# Patient Record
Sex: Female | Born: 1995 | Race: Black or African American | Hispanic: No | Marital: Single | Smoking: Never smoker
Health system: Southern US, Community
[De-identification: ages and names within clinical notes are randomized; demographics above are authoritative.]

## PROBLEM LIST (undated history)

## (undated) DIAGNOSIS — F32A Depression, unspecified: Secondary | ICD-10-CM

## (undated) DIAGNOSIS — Z789 Other specified health status: Secondary | ICD-10-CM

## (undated) DIAGNOSIS — F419 Anxiety disorder, unspecified: Secondary | ICD-10-CM

## (undated) DIAGNOSIS — Q431 Hirschsprung's disease: Secondary | ICD-10-CM

## (undated) HISTORY — DX: Depression, unspecified: F32.A

## (undated) HISTORY — DX: Hirschsprung's disease: Q43.1

## (undated) HISTORY — PX: OTHER SURGICAL HISTORY: SHX169

## (undated) HISTORY — PX: NO PAST SURGERIES: SHX2092

## (undated) HISTORY — PX: LAPAROSCOPIC ENDO-RECTAL PULL THROUGH FOR HIRSCHSPRUNG'S DISEASE: SHX1923

---

## 2019-12-31 ENCOUNTER — Other Ambulatory Visit: Payer: Self-pay

## 2019-12-31 ENCOUNTER — Encounter (HOSPITAL_COMMUNITY): Payer: Self-pay

## 2019-12-31 ENCOUNTER — Inpatient Hospital Stay (HOSPITAL_COMMUNITY)
Admission: AD | Admit: 2019-12-31 | Discharge: 2019-12-31 | Disposition: A | Discharge status: Home / Self Care | Payer: 59 | Attending: Obstetrics and Gynecology | Admitting: Obstetrics and Gynecology

## 2019-12-31 ENCOUNTER — Inpatient Hospital Stay (HOSPITAL_COMMUNITY): Payer: 59

## 2019-12-31 ENCOUNTER — Ambulatory Visit (HOSPITAL_COMMUNITY)
Admission: EM | Admit: 2019-12-31 | Discharge: 2019-12-31 | Disposition: A | Discharge status: Home / Self Care | Payer: 59 | Source: Home / Self Care | Attending: Emergency Medicine | Admitting: Emergency Medicine

## 2019-12-31 ENCOUNTER — Encounter (HOSPITAL_COMMUNITY): Payer: Self-pay | Admitting: Obstetrics and Gynecology

## 2019-12-31 DIAGNOSIS — O200 Threatened abortion: Secondary | ICD-10-CM | POA: Diagnosis not present

## 2019-12-31 DIAGNOSIS — O209 Hemorrhage in early pregnancy, unspecified: Secondary | ICD-10-CM | POA: Diagnosis present

## 2019-12-31 DIAGNOSIS — O3680X0 Pregnancy with inconclusive fetal viability, not applicable or unspecified: Secondary | ICD-10-CM

## 2019-12-31 DIAGNOSIS — Z3A01 Less than 8 weeks gestation of pregnancy: Secondary | ICD-10-CM | POA: Diagnosis not present

## 2019-12-31 HISTORY — DX: Other specified health status: Z78.9

## 2019-12-31 LAB — URINALYSIS, ROUTINE W REFLEX MICROSCOPIC
Bacteria, UA: NONE SEEN
Bilirubin Urine: NEGATIVE
Glucose, UA: NEGATIVE mg/dL
Ketones, ur: 5 mg/dL — AB
Nitrite: NEGATIVE
Protein, ur: NEGATIVE mg/dL
RBC / HPF: >50 RBC/hpf — ABNORMAL HIGH (ref 0–5)
Specific Gravity, Urine: 1.015 (ref 1.005–1.030)
pH: 5 (ref 5.0–8.0)

## 2019-12-31 LAB — CBC
HCT: 40.1 % (ref 36.0–46.0)
Hemoglobin: 12.7 g/dL (ref 12.0–15.0)
MCH: 29.3 pg (ref 26.0–34.0)
MCHC: 31.7 g/dL (ref 30.0–36.0)
MCV: 92.4 fL (ref 80.0–100.0)
Platelets: 184 10*3/uL (ref 150–400)
RBC: 4.34 MIL/uL (ref 3.87–5.11)
RDW: 12.9 % (ref 11.5–15.5)
WBC: 4.9 10*3/uL (ref 4.0–10.5)
nRBC: 0 % (ref 0.0–0.2)

## 2019-12-31 LAB — POC URINE PREG, ED: Preg Test, Ur: POSITIVE — AB

## 2019-12-31 LAB — WET PREP, GENITAL
Sperm: NONE SEEN
Trich, Wet Prep: NONE SEEN
Yeast Wet Prep HPF POC: NONE SEEN

## 2019-12-31 LAB — POCT PREGNANCY, URINE: Preg Test, Ur: POSITIVE — AB

## 2019-12-31 LAB — HCG, QUANTITATIVE, PREGNANCY: hCG, Beta Chain, Quant, S: 2813 m[IU]/mL — ABNORMAL HIGH (ref <5)

## 2019-12-31 NOTE — MAU Note (Signed)
Pt fund out she was pregnant yesterday. Has had spotting on and off for 2 days. No pain.

## 2019-12-31 NOTE — ED Triage Notes (Signed)
Pt states [facility] and took pregnancy test and found out she was pregnant and they said she [redacted] wks pregnant by her last menstrual. Pt states she started having vaginal bleeding 2 days. Pt denies soaking more than 1 pad an hr. Pt states she been having light bleeding and has to change sanitary napkin every 4 hrs or so. Pt denies light headedness and dizziness.

## 2019-12-31 NOTE — ED Notes (Signed)
Patient is being discharged from the Urgent Care Center and sent to the Emergency Department. Per Cam Hai, PA, patient is stable but in need of higher level of care due to positive pregnancy test with vaginal bleeding. Patient is aware and verbalizes understanding of plan of care.  Vitals:   12/31/19 1647  BP: 121/80  Pulse: 72  Resp: 16  Temp: 99.2 F (37.3 C)

## 2019-12-31 NOTE — Discharge Instructions (Signed)
Return to care   If you have heavier bleeding that soaks through more that 2 pads per hour for an hour or more  If you bleed so much that you feel like you might pass out or you do pass out  If you have significant abdominal pain that is not improved with Tylenol       Miscarriage A miscarriage is the loss of an unborn baby (fetus) before the 20th week of pregnancy. Most miscarriages happen during the first 3 months of pregnancy. Sometimes, a miscarriage can happen before a woman knows that she is pregnant. Having a miscarriage can be an emotional experience. If you have had a miscarriage, talk with your health care provider about any questions you may have about miscarrying, the grieving process, and your plans for future pregnancy. What are the causes? A miscarriage may be caused by:  Problems with the genes or chromosomes of the fetus. These problems make it impossible for the baby to develop normally. They are often the result of random errors that occur early in the development of the baby, and are not passed from parent to child (not inherited).  Infection of the cervix or uterus.  Conditions that affect hormone balance in the body.  Problems with the cervix, such as the cervix opening and thinning before pregnancy is at term (cervical insufficiency).  Problems with the uterus. These may include: ? A uterus with an abnormal shape. ? Fibroids in the uterus. ? Congenital abnormalities. These are problems that were present at birth.  Certain medical conditions.  Smoking, drinking alcohol, or using drugs.  Injury (trauma). In many cases, the cause of a miscarriage is not known. What are the signs or symptoms? Symptoms of this condition include:  Vaginal bleeding or spotting, with or without cramps or pain.  Pain or cramping in the abdomen or lower back.  Passing fluid, tissue, or blood clots from the vagina. How is this diagnosed? This condition may be diagnosed based  on:  A physical exam.  Ultrasound.  Blood tests.  Urine tests. How is this treated? Treatment for a miscarriage is sometimes not necessary if you naturally pass all the tissue that was in your uterus. If necessary, this condition may be treated with:  Dilation and curettage (D&C). This is a procedure in which the cervix is stretched open and the lining of the uterus (endometrium) is scraped. This is done only if tissue from the fetus or placenta remains in the body (incomplete miscarriage).  Medicines, such as: ? Antibiotic medicine, to treat infection. ? Medicine to help the body pass any remaining tissue. ? Medicine to reduce (contract) the size of the uterus. These medicines may be given if you have a lot of bleeding. If you have Rh negative blood and your baby was Rh positive, you will need a shot of a medicine called Rh immunoglobulinto protect your future babies from Rh blood problems. "Rh-negative" and "Rh-positive" refer to whether or not the blood has a specific protein found on the surface of red blood cells (Rh factor). Follow these instructions at home: Medicines   Take over-the-counter and prescription medicines only as told by your health care provider.  If you were prescribed antibiotic medicine, take it as told by your health care provider. Do not stop taking the antibiotic even if you start to feel better.  Do not take NSAIDs, such as aspirin and ibuprofen, unless they are approved by your health care provider. These medicines can cause bleeding. Activity  Rest as directed. Ask your health care provider what activities are safe for you.  Have someone help with home and family responsibilities during this time. General instructions  Keep track of the number of sanitary pads you use each day and how soaked (saturated) they are. Write down this information.  Monitor the amount of tissue or blood clots that you pass from your vagina. Save any large amounts of tissue  for your health care provider to examine.  Do not use tampons, douche, or have sex until your health care provider approves.  To help you and your partner with the process of grieving, talk with your health care provider or seek counseling.  When you are ready, meet with your health care provider to discuss any important steps you should take for your health. Also, discuss steps you should take to have a healthy pregnancy in the future.  Keep all follow-up visits as told by your health care provider. This is important. Where to find more information  The American Congress of Obstetricians and Gynecologists: www.acog.org  [place] Department of Health and Human Services Office of Women's Health: www.womenshealth.gov Contact a health care provider if:  You have a fever or chills.  You have a foul smelling vaginal discharge.  You have more bleeding instead of less. Get help right away if:  You have severe cramps or pain in your back or abdomen.  You pass blood clots or tissue from your vagina that is walnut-sized or larger.  You soak more than 1 regular sanitary pad in an hour.  You become light-headed or weak.  You pass out.  You have feelings of sadness that take over your thoughts, or you have thoughts of hurting yourself. Summary  Most miscarriages happen in the first 3 months of pregnancy. Sometimes miscarriage happens before a woman even knows that she is pregnant.  Follow your health care provider's instruction for home care. Keep all follow-up appointments.  To help you and your partner with the process of grieving, talk with your health care provider or seek counseling. This information is not intended to replace advice given to you by your health care provider. Make sure you discuss any questions you have with your health care provider. Document Revised: 12/31/2018 Document Reviewed: 10/14/2016 Elsevier Patient Education  2020 Elsevier Inc.  

## 2019-12-31 NOTE — MAU Provider Note (Signed)
Chief Complaint: Vaginal Bleeding   First Provider Initiated Contact with Patient 12/31/19 1855     SUBJECTIVE HPI: Jennifer Norman is a 24 y.o. G1P0 at [redacted]w[redacted]d who presents [facility] reporting vaginal bleeding. Symptoms started a few days ago. Reports bright red spotting on pad. Changes her pad about every 4 hours. Not saturating pads or passing blood clots. Denies abdominal pain, dysuria, vaginal discharge, or recent intercourse. This is her first pregnancy & she doesn't have an ob/gyn.    Past Medical History:  Diagnosis Date  . Medical history non-contributory    OB History  Gravida Para Term Preterm AB Living  1            SAB TAB Ectopic Multiple Live Births               # Outcome Date GA Lbr Len/2nd Weight Sex Delivery Anes PTL Lv  1 Current            No past surgical history on file. Social History   Socioeconomic History  . Marital status: Single    Spouse name: Not on file  . Number of children: Not on file  . Years of education: Not on file  . Highest education level: Not on file  Occupational History  . Not on file  Tobacco Use  . Smoking status: Never Smoker  . Smokeless tobacco: Never Used  Substance and Sexual Activity  . Alcohol use: Not Currently  . Drug use: Never  . Sexual activity: Not on file  Other Topics Concern  . Not on file  Social History Narrative  . Not on file   Social Determinants of Health   Financial Resource Strain:   . Difficulty of Paying Living Expenses:   Food Insecurity:   . Worried About Programme researcher, broadcasting/film/video in the Last Year:   . Barista in the Last Year:   Transportation Needs:   . Freight forwarder (Medical):   Marland Kitchen Lack of Transportation (Non-Medical):   Physical Activity:   . Days of Exercise per Week:   . Minutes of Exercise per Session:   Stress:   . Feeling of Stress :   Social Connections:   . Frequency of Communication with Friends and Family:   . Frequency of Social Gatherings with  Friends and Family:   . Attends Religious Services:   . Active Member of Clubs or Organizations:   . Attends Banker Meetings:   Marland Kitchen Marital Status:   Intimate Partner Violence:   . Fear of Current or Ex-Partner:   . Emotionally Abused:   Marland Kitchen Physically Abused:   . Sexually Abused:    Family History  Problem Relation Age of Onset  . Asthma Brother    No current facility-administered medications on file prior to encounter.   Current Outpatient Medications on File Prior to Encounter  Medication Sig Dispense Refill  . clindamycin (CLEOCIN) 150 MG capsule Take by mouth 2 (two) times daily.     No Known Allergies  I have reviewed patient's Past Medical Hx, Surgical Hx, Family Hx, Social Hx, medications and allergies.   Review of Systems  Constitutional: Negative.   Gastrointestinal: Negative.   Genitourinary: Positive for vaginal bleeding. Negative for dysuria and vaginal discharge.    OBJECTIVE Patient Vitals for the past 24 hrs:  BP Temp Pulse Resp  12/31/19 1834 123/60 99.2 F (37.3 C) 63 18   Constitutional: Well-developed, well-nourished female in no acute distress.  Cardiovascular: normal rate & rhythm, no murmur Respiratory: normal rate and effort. Lung sounds clear throughout GI: Abd soft, non-tender, Pos BS x 4. No guarding or rebound tenderness MS: Extremities nontender, no edema, normal ROM Neurologic: Alert and oriented x 4.  GU:     SPECULUM EXAM: NEFG, small amount of dark red blood. Cervix pink/smooth/not friable  BIMANUAL: No CMT. cervix closed; uterus normal size, no adnexal tenderness or masses.    LAB RESULTS Results for orders placed or performed during the hospital encounter of 12/31/19 (from the past 24 hour(s))  Urinalysis, Routine w reflex microscopic     Status: Abnormal   Collection Time: 12/31/19  6:27 PM  Result Value Ref Range   Color, Urine YELLOW YELLOW   APPearance CLEAR CLEAR   Specific Gravity, Urine 1.015 1.005 - 1.030   pH  5.0 5.0 - 8.0   Glucose, UA NEGATIVE NEGATIVE mg/dL   Hgb urine dipstick LARGE (A) NEGATIVE   Bilirubin Urine NEGATIVE NEGATIVE   Ketones, ur 5 (A) NEGATIVE mg/dL   Protein, ur NEGATIVE NEGATIVE mg/dL   Nitrite NEGATIVE NEGATIVE   Leukocytes,Ua TRACE (A) NEGATIVE   RBC / HPF >50 (H) 0 - 5 RBC/hpf   WBC, UA 0-5 0 - 5 WBC/hpf   Bacteria, UA NONE SEEN NONE SEEN   Squamous Epithelial / LPF 0-5 0 - 5   Mucus PRESENT   CBC     Status: None   Collection Time: 12/31/19  6:44 PM  Result Value Ref Range   WBC 4.9 4.0 - 10.5 K/uL   RBC 4.34 3.87 - 5.11 MIL/uL   Hemoglobin 12.7 12.0 - 15.0 g/dL   HCT 40.1 36.0 - 46.0 %   MCV 92.4 80.0 - 100.0 fL   MCH 29.3 26.0 - 34.0 pg   MCHC 31.7 30.0 - 36.0 g/dL   RDW 12.9 11.5 - 15.5 %   Platelets 184 150 - 400 K/uL   nRBC 0.0 0.0 - 0.2 %  hCG, quantitative, pregnancy     Status: Abnormal   Collection Time: 12/31/19  6:44 PM  Result Value Ref Range   hCG, Beta Chain, Quant, S 2,813 (H) <5 mIU/mL  ABO/Rh     Status: None   Collection Time: 12/31/19  6:45 PM  Result Value Ref Range   ABO/RH(D)      O POS Performed at [facility], [address], [place],  [address]   Wet prep, genital     Status: Abnormal   Collection Time: 12/31/19  7:19 PM   Specimen: Vaginal  Result Value Ref Range   Yeast Wet Prep HPF POC NONE SEEN NONE SEEN   Trich, Wet Prep NONE SEEN NONE SEEN   Clue Cells Wet Prep HPF POC PRESENT (A) NONE SEEN   WBC, Wet Prep HPF POC FEW (A) NONE SEEN   Sperm NONE SEEN     IMAGING US OB LESS THAN 14 WEEKS WITH OB TRANSVAGINAL  Result Date: 12/31/2019 CLINICAL DATA:  Spotting, no pain EXAM: OBSTETRIC <14 WK [place] AND TRANSVAGINAL OB US TECHNIQUE: Both transabdominal and transvaginal ultrasound examinations were performed for complete evaluation of the gestation as well as the maternal uterus, adnexal regions, and pelvic cul-de-sac. Transvaginal technique was performed to assess early pregnancy. COMPARISON:  None. FINDINGS:  Intrauterine gestational sac: Single intrauterine gestational sac visible within the cervix. Yolk sac:  Visualized. Embryo:  Visualized. Cardiac Activity: Visualized. Heart Rate: 58 bpm CRL: 3.7 mm   6 w  0 d                  [place] EDC: 08/25/2020 Subchorionic hemorrhage:  None visualized. Maternal uterus/adnexae: Ovaries are within normal limits. Right ovary measures 4.8 x 2.9 x 2.7 cm. The left ovary measures 3.6 x 2.1 x 2.1 cm. No significant free fluid. IMPRESSION: Single intrauterine pregnancy, though with the gestational sac, yolk sac and embryo identified within the cervix. There is fetal bradycardia. Concern is raised for cervical ectopic pregnancy versus miscarriage in progress. Short interval ultrasound follow-up may be helpful in distinguishing between the 2. Electronically Signed   By: Jasmine Pang M.D.   On: 12/31/2019 20:04    MAU COURSE Orders Placed This Encounter  Procedures  . Wet prep, genital  . US OB LESS THAN 14 WEEKS WITH OB TRANSVAGINAL  . US OB Transvaginal  . CBC  . hCG, quantitative, pregnancy  . Urinalysis, Routine w reflex microscopic  . ABO/Rh  . Discharge patient   No orders of the defined types were placed in this encounter.   MDM +UPT UA, wet prep, GC/chlamydia, CBC, ABO/Rh, quant hCG, and [place] today to rule out ectopic pregnancy which can be life threatening.   RH positive  Ultrasound shows GS with embryo, FHR 58; pregnancy appears to be in the cervix. Miscarriage in process vs cervical ectopic pregnancy.  Reviewed results with Dr. Jolayne Panther. Will get ultrasound on Friday to reassess location & viability of pregnancy.   Discussed results with pregnancy. Likely a miscarriage but can't completely exclude an ectopic pregnancy. Discussed reasons to return to MAU  ASSESSMENT 1. Threatened miscarriage   2. Vaginal bleeding affecting early pregnancy   3. Pregnancy of unknown anatomic location     PLAN Discharge home in stable condition. Bleeding, pain,  infection precautions Pelvic rest Outpatient ultrasound ordered for Friday   Follow-up Information    [facility].   Specialty: Radiology Why: office will call you to schedule an appointment Contact information: [address] [place] [address], [address] [id number] mc [place] [address] [phone]         Allergies as of 12/31/2019   No Known Allergies     Medication List    STOP taking these medications   clindamycin 150 MG capsule Commonly known as: Osa Craver, Denny Peon, NP 12/31/2019  8:25 PM

## 2019-12-31 NOTE — Discharge Instructions (Addendum)
Please report to the [facility] clinic/MAU for further evaluation.

## 2019-12-31 NOTE — ED Provider Notes (Signed)
Patient reports this urgent care today for evaluation of vaginal bleeding after being told she was pregnant by her [facility].  Patient has positive pregnancy test in this urgent care with vaginal bleeding.  Discussed with patient that she should report to the MAU as this is our policy.  Patient agrees and she will report there following discharge from urgent care.   Hermelinda Medicus, PA-C 12/31/19 1711

## 2020-01-02 LAB — GC/CHLAMYDIA PROBE AMP (~~LOC~~) NOT AT ARMC
Chlamydia: NEGATIVE
Comment: NEGATIVE
Comment: NORMAL
Neisseria Gonorrhea: NEGATIVE

## 2020-01-02 LAB — ABO/RH: ABO/RH(D): O POS

## 2020-01-10 ENCOUNTER — Inpatient Hospital Stay (HOSPITAL_COMMUNITY): Payer: 59

## 2020-01-10 ENCOUNTER — Other Ambulatory Visit: Payer: Self-pay

## 2020-01-10 ENCOUNTER — Ambulatory Visit (HOSPITAL_COMMUNITY): Payer: 59

## 2020-01-10 ENCOUNTER — Encounter (HOSPITAL_COMMUNITY): Payer: Self-pay | Admitting: Family Medicine

## 2020-01-10 ENCOUNTER — Inpatient Hospital Stay (HOSPITAL_COMMUNITY)
Admission: AD | Admit: 2020-01-10 | Discharge: 2020-01-10 | Disposition: A | Discharge status: Home / Self Care | Payer: 59 | Attending: Family Medicine | Admitting: Family Medicine

## 2020-01-10 DIAGNOSIS — O209 Hemorrhage in early pregnancy, unspecified: Secondary | ICD-10-CM | POA: Diagnosis present

## 2020-01-10 DIAGNOSIS — O021 Missed abortion: Secondary | ICD-10-CM | POA: Diagnosis not present

## 2020-01-10 DIAGNOSIS — O034 Incomplete spontaneous abortion without complication: Secondary | ICD-10-CM | POA: Insufficient documentation

## 2020-01-10 DIAGNOSIS — R109 Unspecified abdominal pain: Secondary | ICD-10-CM

## 2020-01-10 DIAGNOSIS — Z3A08 8 weeks gestation of pregnancy: Secondary | ICD-10-CM | POA: Insufficient documentation

## 2020-01-10 DIAGNOSIS — O0289 Other abnormal products of conception: Secondary | ICD-10-CM

## 2020-01-10 DIAGNOSIS — O26899 Other specified pregnancy related conditions, unspecified trimester: Secondary | ICD-10-CM

## 2020-01-10 HISTORY — DX: Anxiety disorder, unspecified: F41.9

## 2020-01-10 LAB — URINALYSIS, ROUTINE W REFLEX MICROSCOPIC
Bilirubin Urine: NEGATIVE
Glucose, UA: NEGATIVE mg/dL
Ketones, ur: NEGATIVE mg/dL
Nitrite: NEGATIVE
Protein, ur: NEGATIVE mg/dL
Specific Gravity, Urine: 1.024 (ref 1.005–1.030)
pH: 5 (ref 5.0–8.0)

## 2020-01-10 LAB — CBC
HCT: 38.6 % (ref 36.0–46.0)
Hemoglobin: 12.2 g/dL (ref 12.0–15.0)
MCH: 28.8 pg (ref 26.0–34.0)
MCHC: 31.6 g/dL (ref 30.0–36.0)
MCV: 91.3 fL (ref 80.0–100.0)
Platelets: 191 10*3/uL (ref 150–400)
RBC: 4.23 MIL/uL (ref 3.87–5.11)
RDW: 12.7 % (ref 11.5–15.5)
WBC: 4 10*3/uL (ref 4.0–10.5)
nRBC: 0 % (ref 0.0–0.2)

## 2020-01-10 LAB — HCG, QUANTITATIVE, PREGNANCY: hCG, Beta Chain, Quant, S: 2136 m[IU]/mL — ABNORMAL HIGH (ref <5)

## 2020-01-10 MED ORDER — HYDROCODONE-ACETAMINOPHEN 5-325 MG PO TABS: 2.0000 | ORAL_TABLET | ORAL | 0 refills | Status: DC | PRN

## 2020-01-10 MED ORDER — PROMETHAZINE HCL 25 MG PO TABS: 12.5000 mg | ORAL_TABLET | Freq: Four times a day (QID) | ORAL | 0 refills | Status: DC | PRN

## 2020-01-10 MED ORDER — IBUPROFEN 600 MG PO TABS: 600.0000 mg | ORAL_TABLET | Freq: Four times a day (QID) | ORAL | 3 refills | Status: DC | PRN

## 2020-01-10 MED ORDER — HYDROCODONE-ACETAMINOPHEN 5-325 MG PO TABS: 2.0000 | ORAL_TABLET | ORAL | 0 refills | Status: AC | PRN

## 2020-01-10 MED ORDER — MISOPROSTOL 200 MCG PO TABS
800.0000 ug | ORAL_TABLET | Freq: Once | ORAL | Status: AC
  Administered 2020-01-10: 800 ug via VAGINAL
  Filled 2020-01-10: qty 4

## 2020-01-10 NOTE — MAU Provider Note (Signed)
History     CSN: [id number]  Arrival date and time: 01/10/20 0932  First Provider Initiated Contact with Patient 01/10/20 1051     Chief Complaint  Patient presents with  . Vaginal Bleeding  . Abdominal Pain   HPI Jennifer Norman is a 24 y.o. G1P0 at [redacted]w[redacted]d who presents to MAU with chief complaints of abdominal pain and vaginal bleeding. Her vaginal bleeding is recurrent and caused her to seek evaluation in MAU on 12/31/2019. Her abdominal pain is new, onset Friday 04/16, sharp, and occurring about 1-2 times each day. She denies pain on arrival to MAU.   Patient states she was unable to secure follow-up in office. She denies dysuria, abdominal tenderness, fever or recent illness.  OB History    Gravida  1   Para      Term      Preterm      AB      Living        SAB      TAB      Ectopic      Multiple      Live Births              Past Medical History:  Diagnosis Date  . Anxiety   . Medical history non-contributory     Past Surgical History:  Procedure Laterality Date  . NO PAST SURGERIES      Family History  Problem Relation Age of Onset  . Asthma Brother   . Healthy Mother   . Healthy Father     Social History   Tobacco Use  . Smoking status: Never Smoker  . Smokeless tobacco: Never Used  Substance Use Topics  . Alcohol use: Not Currently  . Drug use: Never    Allergies: No Known Allergies  No medications prior to admission.    Review of Systems  Gastrointestinal: Positive for abdominal pain.  Genitourinary: Positive for vaginal bleeding.  All other systems reviewed and are negative.  Physical Exam   Blood pressure 119/61, pulse 70, temperature 98.6 F (37 C), temperature source Oral, resp. rate 16, height 5\' 9"  (1.753 m), weight 94.5 kg, last menstrual period 11/12/2019, SpO2 96 %.  Physical Exam  Nursing note and vitals reviewed. Constitutional: She is oriented to person, place, and time. She appears well-developed and  well-nourished.  Cardiovascular: Normal rate and normal heart sounds.  Respiratory: Effort normal and breath sounds normal.  GI: Soft. Bowel sounds are normal. She exhibits no distension. There is no abdominal tenderness. There is no rebound and no guarding.  Genitourinary:    Vaginal discharge present.     Genitourinary Comments: Pelvic exam: External genitalia normal, vaginal walls pink and well rugated, cervix visually closed, no lesions noted. Small amount dark red blood visible near cervical os. Removed with fox swab x 1    Neurological: She is alert and oriented to person, place, and time.  Skin: Skin is warm and dry.  Psychiatric: She has a normal mood and affect. Her behavior is normal. Judgment and thought content normal.    MAU Course/MDM  Procedures: Speculum exam, transvaginal ultrasound  --MAU notes reviewed. Previous ultrasound visualized GS, YS and embryo within cervix as well as fetal bradycardia.  Patient Vitals for the past 24 hrs:  BP Temp Temp src Pulse Resp SpO2 Height Weight  01/10/20 1337 118/64 98.2 F (36.8 C) Oral 60 17 99 % -- --  01/10/20 1028 119/61 -- -- 70 16 -- -- 94.5 kg  01/10/20 0927 127/68 98.6 F (37 C) Oral 88 16 96 % 5\' 9"  (1.753 m) --   Results for orders placed or performed during the hospital encounter of 01/10/20 (from the past 24 hour(s))  Urinalysis, Routine w reflex microscopic     Status: Abnormal   Collection Time: 01/10/20 10:55 AM  Result Value Ref Range   Color, Urine YELLOW YELLOW   APPearance HAZY (A) CLEAR   Specific Gravity, Urine 1.024 1.005 - 1.030   pH 5.0 5.0 - 8.0   Glucose, UA NEGATIVE NEGATIVE mg/dL   Hgb urine dipstick LARGE (A) NEGATIVE   Bilirubin Urine NEGATIVE NEGATIVE   Ketones, ur NEGATIVE NEGATIVE mg/dL   Protein, ur NEGATIVE NEGATIVE mg/dL   Nitrite NEGATIVE NEGATIVE   Leukocytes,Ua SMALL (A) NEGATIVE   RBC / HPF 21-50 0 - 5 RBC/hpf   WBC, UA 21-50 0 - 5 WBC/hpf   Bacteria, UA RARE (A) NONE SEEN    Squamous Epithelial / LPF 0-5 0 - 5   Mucus PRESENT   CBC     Status: None   Collection Time: 01/10/20 11:14 AM  Result Value Ref Range   WBC 4.0 4.0 - 10.5 K/uL   RBC 4.23 3.87 - 5.11 MIL/uL   Hemoglobin 12.2 12.0 - 15.0 g/dL   HCT 38.6 36.0 - 46.0 %   MCV 91.3 80.0 - 100.0 fL   MCH 28.8 26.0 - 34.0 pg   MCHC 31.6 30.0 - 36.0 g/dL   RDW 12.7 11.5 - 15.5 %   Platelets 191 150 - 400 K/uL   nRBC 0.0 0.0 - 0.2 %  hCG, quantitative, pregnancy     Status: Abnormal   Collection Time: 01/10/20 11:14 AM  Result Value Ref Range   hCG, Beta Chain, Quant, S 2,136 (H) <5 mIU/mL   US OB Transvaginal  Result Date: 01/10/2020 CLINICAL DATA:  Vaginal bleeding in first trimester of pregnancy, bleeding since 12/30/2019, fetal bradycardia on prior exam, LMP 11/12/2019 EXAM: TRANSVAGINAL OB ULTRASOUND TECHNIQUE: Transvaginal ultrasound was performed for complete evaluation of the gestation as well as the maternal uterus, adnexal regions, and pelvic cul-de-sac. COMPARISON:  12/31/2019 FINDINGS: Intrauterine gestational sac: Present, abnormal, elongated, now located at lower uterine segment, previously at upper uterine segment Yolk sac:  N/A Embryo:  N/A Cardiac Activity: N/A Heart Rate: N/A bpm MSD: 8.8 mm   5 w   4 d Subchorionic hemorrhage: Blood identified surrounding gestational sac and products of conception within endometrial canal Maternal uterus/adnexae: No additional uterine masses. RIGHT ovary normal size and morphology 3.4 x 1.8 x 2.0 cm. LEFT ovary normal size and morphology, 2.8 x 2.6 x 2.1 cm. No free pelvic fluid or adnexal masses. IMPRESSION: Previously identified gestational sac is now elongated and seen at the lower uterine segment surrounded by blood, without visualization of the fetal pole and yolk sac identified on prior study. Findings meet definitive criteria for failed pregnancy. This follows SRU consensus guidelines: Diagnostic Criteria for Nonviable Pregnancy Early in the First Trimester.  Alison Stalling J Med [phone]. Electronically Signed   By: Lavonia Dana M.D.   On: 01/10/2020 12:08    --Confirmed with Dr. Kennon Rounds that patient is appropriate for Cytotec administration  Early Intrauterine Pregnancy Failure  X  Documented intrauterine pregnancy failure less than or equal to [redacted] weeks gestation  X  No serious current illness  X  Baseline Hgb greater than or equal to 10g/dl  X  Patient has easily accessible transportation to the hospital  X  Clear preference  X  Practitioner/physician deems patient reliable  X  Counseling by practitioner or physician  X  Patient education by RN  N/A  Rho-Gam given by RN if indicated  X Medication dispensed: Intravaginally by RN in MAU  X  Ibuprofen 600 mg 1 tablet by mouth every 6 hours as needed #30  X  Hydrocodone/acetaminophen 5/325 mg by mouth every 4 to 6 hours as needed  X  Phenergan 12.5 mg by mouth every 4 hours as needed for nausea  Meds ordered this encounter  Medications  . misoprostol (CYTOTEC) tablet 800 mcg    Failed pregnancy  . HYDROcodone-acetaminophen (NORCO/VICODIN) 5-325 MG tablet    Sig: Take 2 tablets by mouth every 4 (four) hours as needed for up to 3 days.    Dispense:  6 tablet    Refill:  0    Order Specific Question:   Supervising Provider    Answer:   Reva Bores [[id number]]  . ibuprofen (ADVIL) 600 MG tablet    Sig: Take 1 tablet (600 mg total) by mouth every 6 (six) hours as needed.    Dispense:  60 tablet    Refill:  3    Order Specific Question:   Supervising Provider    Answer:   Reva Bores [[id number]]  . promethazine (PHENERGAN) 25 MG tablet    Sig: Take 0.5 tablets (12.5 mg total) by mouth every 6 (six) hours as needed for nausea or vomiting.    Dispense:  30 tablet    Refill:  0    Order Specific Question:   Supervising Provider    Answer:   Reva Bores [[id number]]   Assessment and Plan  --24 y.o. G1P0 at [redacted]w[redacted]d  --Confirmed failed pregnancy --S/p Cytotec administered in MAU --Blood  type O POS --Discharge home in stable condition  F/U: --Non-stat Quant hCG in one week at [facility] --Message sent to clinic to coordinate appointment  Calvert Cantor, CNM 01/10/2020, 5:07 PM

## 2020-01-10 NOTE — MAU Note (Signed)
Was to have followed up in clinic last wk, they did not call and she states she tried to call and got nowhere. Started bleeding on the 9th. Has slowed down, but has continued to bleed.  Pain started on Friday, sharp pains in lower abd.

## 2020-01-10 NOTE — Discharge Instructions (Signed)
Prostaglandin-Induced Abortion, Care After This sheet gives you information about how to care for yourself after your procedure. Your health care provider may also give you more specific instructions. If you have problems or questions, contact your health care provider. What can I expect after the procedure? After the procedure, it is common to have:  Bleeding that lasts for a few hours or a few days. It may feel like you are having a heavy menstrual period.  A headache.  Diarrhea.  Nausea and vomiting.  Chills.  Dizziness. Your next period will most likely start 4-6 weeks after the procedure, unless you start taking birth control pills. Follow these instructions at home: Medicines   Take over-the-counter and prescription medicines only as told by your health care provider.  Only take the medicines your health care provider recommends. Do not take aspirin. It can cause bleeding. Activity  Do not have sex for 2-3 weeks or until your health care provider approves.  Rest and avoid activity that requires a lot of energy for 2-3 weeks.  Do not drive or use heavy machinery while taking prescription pain medicine. General instructions  There will be bleeding after the procedure. It is recommended that you: ? Write down how many menstrual pads you use each day and how soaked they are. This could be useful information for your health care provider. ? Check for any large blood clots or tissue when you change your menstrual pad. If you pass tissue, save the tissue to show to your health care provider.  Do not douche or use tampons until your health care provider approves.  Ask your health care provider when you can start using hormonal birth control (contraception).  Keep all follow-up visits as told by your health care provider. This is important. Contact a health care provider if:  You have chills or a fever.  You have pain that is not relieved by prescription pain  medicine.  You have a bad-smelling vaginal discharge.  You have pain or bleeding that gets worse instead of better.  You have any of the following for more than 24 hours: ? Nausea. ? Vomiting. ? Diarrhea. Get help right away if:  You have severe cramps in your stomach, back, or abdomen.  You pass large blood clots or tissue out of your vagina. Save any tissue for your health care provider to inspect.  You need to change your pad more than once in an hour.  You become light-headed, weak, or faint. Summary  After the procedure it is common to have bleeding, headache, diarrhea, nausea and vomiting, chills, and dizziness.  Take over-the-counter and prescription medicines only as told by your health care provider.  Do not have sex for 2-3 weeks or until your health care provider approves.  When you are bleeding after the procedure, write down how many menstrual pads you use each day and how soaked they are.  Keep all follow-up visits as told by your health care provider. This is important. This information is not intended to replace advice given to you by your health care provider. Make sure you discuss any questions you have with your health care provider. Document Revised: 08/21/2017 Document Reviewed: 11/26/2016 Elsevier Patient Education  2020 [employer].

## 2020-01-11 ENCOUNTER — Telehealth: Payer: Self-pay | Admitting: General Practice

## 2020-01-11 NOTE — Telephone Encounter (Signed)
-----   Message from Jennifer Norman, [place] sent at 01/10/2020  4:58 PM EDT ----- Patient with nonviable pregnancy, s/p Cytotec in MAU. Needs repeat non-stat Quant hCG in one week please

## 2020-01-11 NOTE — Telephone Encounter (Signed)
Pt aware of appointment scheduled on Tuesday, 01/17/2020 at 9:00am for repeat non-stat Quant hCG.  Patient verbalized understanding.

## 2020-01-12 ENCOUNTER — Ambulatory Visit (HOSPITAL_COMMUNITY): Admission: RE | Admit: 2020-01-12 | Payer: 59 | Source: Ambulatory Visit

## 2020-01-17 ENCOUNTER — Other Ambulatory Visit: Payer: 59

## 2020-02-06 ENCOUNTER — Ambulatory Visit: Admission: RE | Admit: 2020-02-06 | Payer: 59 | Source: Ambulatory Visit

## 2020-02-08 ENCOUNTER — Inpatient Hospital Stay: Admission: RE | Admit: 2020-02-08 | Payer: 59 | Source: Ambulatory Visit

## 2020-02-08 ENCOUNTER — Ambulatory Visit
Admission: RE | Admit: 2020-02-08 | Discharge: 2020-02-08 | Disposition: A | Discharge status: Home / Self Care | Payer: 59 | Source: Ambulatory Visit | Attending: Student | Admitting: Student

## 2020-02-08 ENCOUNTER — Ambulatory Visit (INDEPENDENT_AMBULATORY_CARE_PROVIDER_SITE_OTHER): Payer: 59 | Admitting: *Deleted

## 2020-02-08 ENCOUNTER — Other Ambulatory Visit: Payer: Self-pay

## 2020-02-08 DIAGNOSIS — O3680X0 Pregnancy with inconclusive fetal viability, not applicable or unspecified: Secondary | ICD-10-CM | POA: Diagnosis present

## 2020-02-08 DIAGNOSIS — O200 Threatened abortion: Secondary | ICD-10-CM | POA: Insufficient documentation

## 2020-02-08 DIAGNOSIS — Z712 Person consulting for explanation of examination or test findings: Secondary | ICD-10-CM

## 2020-02-08 DIAGNOSIS — O209 Hemorrhage in early pregnancy, unspecified: Secondary | ICD-10-CM | POA: Insufficient documentation

## 2020-02-08 NOTE — Progress Notes (Signed)
[place] results reviewed by Dr. Debroah Loop. Pt was informed that the results are consistent with complete miscarriage. She does not desire birth control @ this time. She was advised to use condoms until first normal period occurs. Pt requests follow up w/MD and needs annual Gyn exam w/Pap. Appointment will be scheduled. Pt voiced understanding of all information given and had no questions.

## 2020-02-10 NOTE — Progress Notes (Signed)
Patient ID: Jennifer Norman, female   DOB: 26-Mar-1996, 24 y.o.   MRN: [id number] Patient was assessed and managed by nursing staff during this encounter. I have reviewed the chart and agree with the documentation and plan. I have also made any necessary editorial changes. I reviewed the [place] result from 02/08/20.  Scheryl Darter, MD 02/10/2020 11:12 AM

## 2020-03-13 ENCOUNTER — Ambulatory Visit: Payer: 59 | Admitting: Women's Health

## 2020-03-13 ENCOUNTER — Encounter: Payer: Self-pay | Admitting: Women's Health

## 2020-06-02 ENCOUNTER — Other Ambulatory Visit: Payer: Self-pay

## 2020-06-02 ENCOUNTER — Ambulatory Visit (HOSPITAL_COMMUNITY)
Admission: EM | Admit: 2020-06-02 | Discharge: 2020-06-02 | Disposition: A | Discharge status: Home / Self Care | Payer: 59 | Attending: Family Medicine | Admitting: Family Medicine

## 2020-06-02 ENCOUNTER — Encounter (HOSPITAL_COMMUNITY): Payer: Self-pay

## 2020-06-02 DIAGNOSIS — R109 Unspecified abdominal pain: Secondary | ICD-10-CM

## 2020-06-02 DIAGNOSIS — R1030 Lower abdominal pain, unspecified: Secondary | ICD-10-CM | POA: Diagnosis not present

## 2020-06-02 DIAGNOSIS — Z3201 Encounter for pregnancy test, result positive: Secondary | ICD-10-CM | POA: Diagnosis present

## 2020-06-02 LAB — POCT URINALYSIS DIPSTICK, ED / UC
Bilirubin Urine: NEGATIVE
Glucose, UA: NEGATIVE mg/dL
Hgb urine dipstick: NEGATIVE
Ketones, ur: NEGATIVE mg/dL
Leukocytes,Ua: NEGATIVE
Nitrite: NEGATIVE
Protein, ur: NEGATIVE mg/dL
Specific Gravity, Urine: 1.025 (ref 1.005–1.030)
Urobilinogen, UA: 1 mg/dL (ref 0.0–1.0)
pH: 7 (ref 5.0–8.0)

## 2020-06-02 LAB — POC URINE PREG, ED: Preg Test, Ur: POSITIVE — AB

## 2020-06-02 NOTE — ED Triage Notes (Signed)
Pt is pregnant, but not sure how far along she is. Pt states she did a home pregnancy test two days ago and it was +. Pt c/o 2/10 abdominal discomfort in LLQ, RLQ of abdomen and epigastric area that started yesterday after she ate. Pt c/o 5/10 non radiating pressure on right side of chest that started today. Pt states she's always had the chest pain from "time to time, but recently has been more frequent. She states she doesn't know if it's due to stress or what. Pt denies SOB or vomiting. Pt has non labored breathing.

## 2020-06-02 NOTE — Discharge Instructions (Signed)
Schedule a new OB appointment with an OBGYN of your choice as soon as you are able

## 2020-06-02 NOTE — ED Provider Notes (Signed)
[facility]    CSN: [id number] Arrival date & time: 06/02/20  1546      History   Chief Complaint Chief Complaint  Patient presents with  . Abdominal Pain    HPI Jennifer Norman is a 24 y.o. female.    Intermittent sharp pains and pressure b/l lower abdomen and epigastric area since yesterday that she rates 2/10. Also had some episodic chest pain last night that has been a reoccurring thing for her and has been worked up in the past. Significant stress lately with recently finding out she's pregnant, feels her chest pain is directly related to stress. LMP was 03/29/2020 and positive home preg test 2 days ago. Denies vaginal bleeding, N/V/D, vaginal discharge, fever, chills, concern for STIs. Of note, her previous pregnancy ended in a miscarriage. Does not yet have an OBGYN.      Past Medical History:  Diagnosis Date  . Anxiety   . Medical history non-contributory     Patient Active Problem List   Diagnosis Date Noted  . Nonviable pregnancy 01/10/2020    Past Surgical History:  Procedure Laterality Date  . NO PAST SURGERIES      OB History    Gravida  1   Para      Term      Preterm      AB      Living        SAB      TAB      Ectopic      Multiple      Live Births               Home Medications    Prior to Admission medications   Medication Sig Start Date End Date Taking? Authorizing Provider  ibuprofen (ADVIL) 600 MG tablet Take 1 tablet (600 mg total) by mouth every 6 (six) hours as needed. 01/10/20   Calvert Cantor, CNM  promethazine (PHENERGAN) 25 MG tablet Take 0.5 tablets (12.5 mg total) by mouth every 6 (six) hours as needed for nausea or vomiting. 01/10/20   Calvert Cantor, CNM    Family History Family History  Problem Relation Age of Onset  . Asthma Brother   . Healthy Mother   . Healthy Father     Social History Social History   Tobacco Use  . Smoking status: Never Smoker  . Smokeless tobacco: Never  Used  Vaping Use  . Vaping Use: Never used  Substance Use Topics  . Alcohol use: Not Currently  . Drug use: Never     Allergies   Patient has no known allergies.   Review of Systems Review of Systems PER HPI    Physical Exam Triage Vital Signs ED Triage Vitals  Enc Vitals Group     BP 06/02/20 1719 107/64     Pulse Rate 06/02/20 1719 81     Resp 06/02/20 1719 16     Temp 06/02/20 1719 99.4 F (37.4 C)     Temp Source 06/02/20 1719 Oral     SpO2 06/02/20 1719 100 %     Weight 06/02/20 1720 202 lb (91.6 kg)     Height 06/02/20 1720 5\' 9"  (1.753 m)     Head Circumference --      Peak Flow --      Pain Score 06/02/20 1719 5     Pain Loc --      Pain Edu? --      Excl. in GC? --  No data found.  Updated Vital Signs BP 107/64   Pulse 81   Temp 99.4 F (37.4 C) (Oral)   Resp 16   Ht 5\' 9"  (1.753 m)   Wt 202 lb (91.6 kg)   LMP 11/12/2019   SpO2 100%   BMI 29.83 kg/m   Visual Acuity Right Eye Distance:   Left Eye Distance:   Bilateral Distance:    Right Eye Near:   Left Eye Near:    Bilateral Near:     Physical Exam Vitals and nursing note reviewed.  Constitutional:      Appearance: Normal appearance. She is not ill-appearing.  HENT:     Head: Atraumatic.  Eyes:     Extraocular Movements: Extraocular movements intact.     Conjunctiva/sclera: Conjunctivae normal.  Cardiovascular:     Rate and Rhythm: Normal rate and regular rhythm.     Pulses: Normal pulses.     Heart sounds: Normal heart sounds.  Pulmonary:     Effort: Pulmonary effort is normal.     Breath sounds: Normal breath sounds. No wheezing or rales.  Abdominal:     General: Bowel sounds are normal. There is no distension.     Palpations: Abdomen is soft.     Tenderness: There is abdominal tenderness (lower abdominal ttp (described as pressure) b/l). There is no right CVA tenderness, left CVA tenderness, guarding or rebound.  Genitourinary:    Comments: Declines pelvic exam  today Musculoskeletal:        General: Normal range of motion.     Cervical back: Normal range of motion and neck supple.  Skin:    General: Skin is warm and dry.  Neurological:     Mental Status: She is alert and oriented to person, place, and time.  Psychiatric:        Mood and Affect: Mood normal.        Thought Content: Thought content normal.        Judgment: Judgment normal.    UC Treatments / Results  Labs (all labs ordered are listed, but only abnormal results are displayed) Labs Reviewed  POC URINE PREG, ED - Abnormal; Notable for the following components:      Result Value   Preg Test, Ur POSITIVE (*)    All other components within normal limits  POCT URINALYSIS DIPSTICK, ED / UC  CERVICOVAGINAL ANCILLARY ONLY    EKG   Radiology No results found.  Procedures Procedures (including critical care time)  Medications Ordered in UC Medications - No data to display  Initial Impression / Assessment and Plan / UC Course  I have reviewed the triage vital signs and the nursing notes.  Pertinent labs & imaging results that were available during my care of the patient were reviewed by me and considered in my medical decision making (see chart for details).     U/A without evidence of UTI, urine preg positive today in clinic. Exam and vital signs reassuring. Per patient she is nervous about her pain given her past miscarriage. Discussed that we are unable to perform an u/s here in this setting and that her exam was reassuring but should her pain worsen or she experience vaginal bleeding she should go to the ER. She is stable to f/u with OBGYN next week outpatient otherwise. Aptima swab pending.  She declines EKG today to evaluate her CP last night as this has already been worked up in the past and is now resolved. Lungs CTAB, O2 saturation  100%.  Final Clinical Impressions(s) / UC Diagnoses   Final diagnoses:  Lower abdominal pain  Positive pregnancy test      Discharge Instructions     Schedule a new OB appointment with an OBGYN of your choice as soon as you are able    ED Prescriptions    None     PDMP not reviewed this encounter.   Particia Nearing, [place] 06/02/20 2020

## 2020-06-04 ENCOUNTER — Inpatient Hospital Stay (HOSPITAL_COMMUNITY): Payer: 59

## 2020-06-04 ENCOUNTER — Encounter (HOSPITAL_COMMUNITY): Payer: Self-pay | Admitting: Obstetrics & Gynecology

## 2020-06-04 ENCOUNTER — Inpatient Hospital Stay (HOSPITAL_COMMUNITY)
Admission: AD | Admit: 2020-06-04 | Discharge: 2020-06-04 | Disposition: A | Discharge status: Home / Self Care | Payer: 59 | Attending: Obstetrics & Gynecology | Admitting: Obstetrics & Gynecology

## 2020-06-04 ENCOUNTER — Other Ambulatory Visit: Payer: Self-pay

## 2020-06-04 DIAGNOSIS — O219 Vomiting of pregnancy, unspecified: Secondary | ICD-10-CM | POA: Diagnosis not present

## 2020-06-04 DIAGNOSIS — R1030 Lower abdominal pain, unspecified: Secondary | ICD-10-CM | POA: Insufficient documentation

## 2020-06-04 DIAGNOSIS — Z3A09 9 weeks gestation of pregnancy: Secondary | ICD-10-CM | POA: Diagnosis not present

## 2020-06-04 DIAGNOSIS — R109 Unspecified abdominal pain: Secondary | ICD-10-CM | POA: Diagnosis not present

## 2020-06-04 DIAGNOSIS — O26891 Other specified pregnancy related conditions, first trimester: Secondary | ICD-10-CM | POA: Diagnosis not present

## 2020-06-04 LAB — CBC WITH DIFFERENTIAL/PLATELET
Abs Immature Granulocytes: 0.01 10*3/uL (ref 0.00–0.07)
Basophils Absolute: 0 10*3/uL (ref 0.0–0.1)
Basophils Relative: 0 %
Eosinophils Absolute: 0 10*3/uL (ref 0.0–0.5)
Eosinophils Relative: 0 %
HCT: 38.8 % (ref 36.0–46.0)
Hemoglobin: 12.4 g/dL (ref 12.0–15.0)
Immature Granulocytes: 0 %
Lymphocytes Relative: 40 %
Lymphs Abs: 1.9 10*3/uL (ref 0.7–4.0)
MCH: 28.1 pg (ref 26.0–34.0)
MCHC: 32 g/dL (ref 30.0–36.0)
MCV: 88 fL (ref 80.0–100.0)
Monocytes Absolute: 0.4 10*3/uL (ref 0.1–1.0)
Monocytes Relative: 8 %
Neutro Abs: 2.4 10*3/uL (ref 1.7–7.7)
Neutrophils Relative %: 52 %
Platelets: 197 10*3/uL (ref 150–400)
RBC: 4.41 MIL/uL (ref 3.87–5.11)
RDW: 13.6 % (ref 11.5–15.5)
WBC: 4.8 10*3/uL (ref 4.0–10.5)
nRBC: 0 % (ref 0.0–0.2)

## 2020-06-04 LAB — URINALYSIS, ROUTINE W REFLEX MICROSCOPIC
Bilirubin Urine: NEGATIVE
Glucose, UA: NEGATIVE mg/dL
Hgb urine dipstick: NEGATIVE
Ketones, ur: NEGATIVE mg/dL
Leukocytes,Ua: NEGATIVE
Nitrite: NEGATIVE
Protein, ur: NEGATIVE mg/dL
Specific Gravity, Urine: 1.023 (ref 1.005–1.030)
pH: 6 (ref 5.0–8.0)

## 2020-06-04 LAB — COMPREHENSIVE METABOLIC PANEL
ALT: 13 U/L (ref 0–44)
AST: 18 U/L (ref 15–41)
Albumin: 3.6 g/dL (ref 3.5–5.0)
Alkaline Phosphatase: 28 U/L — ABNORMAL LOW (ref 38–126)
Anion gap: 9 (ref 5–15)
BUN: 9 mg/dL (ref 6–20)
CO2: 24 mmol/L (ref 22–32)
Calcium: 9.3 mg/dL (ref 8.9–10.3)
Chloride: 103 mmol/L (ref 98–111)
Creatinine, Ser: 0.76 mg/dL (ref 0.44–1.00)
GFR calc Af Amer: >60 mL/min (ref >60)
GFR calc non Af Amer: >60 mL/min (ref >60)
Glucose, Bld: 80 mg/dL (ref 70–99)
Potassium: 3.8 mmol/L (ref 3.5–5.1)
Sodium: 136 mmol/L (ref 135–145)
Total Bilirubin: 0.9 mg/dL (ref 0.3–1.2)
Total Protein: 6.9 g/dL (ref 6.5–8.1)

## 2020-06-04 LAB — HCG, QUANTITATIVE, PREGNANCY: hCG, Beta Chain, Quant, S: 121469 m[IU]/mL — ABNORMAL HIGH (ref <5)

## 2020-06-04 LAB — WET PREP, GENITAL
Trich, Wet Prep: NONE SEEN
Yeast Wet Prep HPF POC: NONE SEEN

## 2020-06-04 LAB — CERVICOVAGINAL ANCILLARY ONLY
Bacterial Vaginitis (gardnerella): POSITIVE — AB
Candida Glabrata: NEGATIVE
Candida Vaginitis: NEGATIVE
Chlamydia: NEGATIVE
Comment: NEGATIVE
Comment: NEGATIVE
Comment: NEGATIVE
Comment: NEGATIVE
Comment: NEGATIVE
Comment: NORMAL
Neisseria Gonorrhea: NEGATIVE
Trichomonas: NEGATIVE

## 2020-06-04 LAB — HIV ANTIBODY (ROUTINE TESTING W REFLEX): HIV Screen 4th Generation wRfx: NONREACTIVE

## 2020-06-04 MED ORDER — DOXYLAMINE-PYRIDOXINE 10-10 MG PO TBEC: DELAYED_RELEASE_TABLET | ORAL | 0 refills | Status: DC

## 2020-06-04 NOTE — Discharge Instructions (Signed)
First Trimester of Pregnancy The first trimester of pregnancy is from week 1 until the end of week 13 (months 1 through 3). A week after a sperm fertilizes an egg, the egg will implant on the wall of the uterus. This embryo will begin to develop into a baby. Genes from you and your partner will form the baby. The female genes will determine whether the baby will be a boy or a girl. At 6-8 weeks, the eyes and face will be formed, and the heartbeat can be seen on ultrasound. At the end of 12 weeks, all the baby's organs will be formed. Now that you are pregnant, you will want to do everything you can to have a healthy baby. Two of the most important things are to get good prenatal care and to follow your health care provider's instructions. Prenatal care is all the medical care you receive before the baby's birth. This care will help prevent, find, and treat any problems during the pregnancy and childbirth. Body changes during your first trimester Your body goes through many changes during pregnancy. The changes vary from woman to woman.  You may gain or lose a couple of pounds at first.  You may feel sick to your stomach (nauseous) and you may throw up (vomit). If the vomiting is uncontrollable, call your health care provider.  You may tire easily.  You may develop headaches that can be relieved by medicines. All medicines should be approved by your health care provider.  You may urinate more often. Painful urination may mean you have a bladder infection.  You may develop heartburn as a result of your pregnancy.  You may develop constipation because certain hormones are causing the muscles that push stool through your intestines to slow down.  You may develop hemorrhoids or swollen veins (varicose veins).  Your breasts may begin to grow larger and become tender. Your nipples may stick out more, and the tissue that surrounds them (areola) may become darker.  Your gums may bleed and may be  sensitive to brushing and flossing.  Dark spots or blotches (chloasma, mask of pregnancy) may develop on your face. This will likely fade after the baby is born.  Your menstrual periods will stop.  You may have a loss of appetite.  You may develop cravings for certain kinds of food.  You may have changes in your emotions from day to day, such as being excited to be pregnant or being concerned that something may go wrong with the pregnancy and baby.  You may have more vivid and strange dreams.  You may have changes in your hair. These can include thickening of your hair, rapid growth, and changes in texture. Some women also have hair loss during or after pregnancy, or hair that feels dry or thin. Your hair will most likely return to normal after your baby is born. What to expect at prenatal visits During a routine prenatal visit:  You will be weighed to make sure you and the baby are growing normally.  Your blood pressure will be taken.  Your abdomen will be measured to track your baby's growth.  The fetal heartbeat will be listened to between weeks 10 and 14 of your pregnancy.  Test results from any previous visits will be discussed. Your health care provider may ask you:  How you are feeling.  If you are feeling the baby move.  If you have had any abnormal symptoms, such as leaking fluid, bleeding, severe headaches, or abdominal   cramping.  If you are using any tobacco products, including cigarettes, chewing tobacco, and electronic cigarettes.  If you have any questions. Other tests that may be performed during your first trimester include:  Blood tests to find your blood type and to check for the presence of any previous infections. The tests will also be used to check for low iron levels (anemia) and protein on red blood cells (Rh antibodies). Depending on your risk factors, or if you previously had diabetes during pregnancy, you may have tests to check for high blood sugar  that affects pregnant women (gestational diabetes).  Urine tests to check for infections, diabetes, or protein in the urine.  An ultrasound to confirm the proper growth and development of the baby.  Fetal screens for spinal cord problems (spina bifida) and Down syndrome.  HIV (human immunodeficiency virus) testing. Routine prenatal testing includes screening for HIV, unless you choose not to have this test.  You may need other tests to make sure you and the baby are doing well. Follow these instructions at home: Medicines  Follow your health care provider's instructions regarding medicine use. Specific medicines may be either safe or unsafe to take during pregnancy.  Take a prenatal vitamin that contains at least 600 micrograms (mcg) of folic acid.  If you develop constipation, try taking a stool softener if your health care provider approves. Eating and drinking   Eat a balanced diet that includes fresh fruits and vegetables, whole grains, good sources of protein such as meat, eggs, or tofu, and low-fat dairy. Your health care provider will help you determine the amount of weight gain that is right for you.  Avoid raw meat and uncooked cheese. These carry germs that can cause birth defects in the baby.  Eating four or five small meals rather than three large meals a day may help relieve nausea and vomiting. If you start to feel nauseous, eating a few soda crackers can be helpful. Drinking liquids between meals, instead of during meals, also seems to help ease nausea and vomiting.  Limit foods that are high in fat and processed sugars, such as fried and sweet foods.  To prevent constipation: ? Eat foods that are high in fiber, such as fresh fruits and vegetables, whole grains, and beans. ? Drink enough fluid to keep your urine clear or pale yellow. Activity  Exercise only as directed by your health care provider. Most women can continue their usual exercise routine during  pregnancy. Try to exercise for 30 minutes at least 5 days a week. Exercising will help you: ? Control your weight. ? Stay in shape. ? Be prepared for labor and delivery.  Experiencing pain or cramping in the lower abdomen or lower back is a good sign that you should stop exercising. Check with your health care provider before continuing with normal exercises.  Try to avoid standing for long periods of time. Move your legs often if you must stand in one place for a long time.  Avoid heavy lifting.  Wear low-heeled shoes and practice good posture.  You may continue to have sex unless your health care provider tells you not to. Relieving pain and discomfort  Wear a good support bra to relieve breast tenderness.  Take warm sitz baths to soothe any pain or discomfort caused by hemorrhoids. Use hemorrhoid cream if your health care provider approves.  Rest with your legs elevated if you have leg cramps or low back pain.  If you develop varicose veins in   your legs, wear support hose. Elevate your feet for 15 minutes, 3-4 times a day. Limit salt in your diet. Prenatal care  Schedule your prenatal visits by the twelfth week of pregnancy. They are usually scheduled monthly at first, then more often in the last 2 months before delivery.  Write down your questions. Take them to your prenatal visits.  Keep all your prenatal visits as told by your health care provider. This is important. Safety  Wear your seat belt at all times when driving.  Make a list of emergency phone numbers, including numbers for family, friends, the hospital, and police and fire departments. General instructions  Ask your health care provider for a referral to a local prenatal education class. Begin classes no later than the beginning of month 6 of your pregnancy.  Ask for help if you have counseling or nutritional needs during pregnancy. Your health care provider can offer advice or refer you to specialists for help  with various needs.  Do not use hot tubs, steam rooms, or saunas.  Do not douche or use tampons or scented sanitary pads.  Do not cross your legs for long periods of time.  Avoid cat litter boxes and soil used by cats. These carry germs that can cause birth defects in the baby and possibly loss of the fetus by miscarriage or stillbirth.  Avoid all smoking, herbs, alcohol, and medicines not prescribed by your health care provider. Chemicals in these products affect the formation and growth of the baby.  Do not use any products that contain nicotine or tobacco, such as cigarettes and e-cigarettes. If you need help quitting, ask your health care provider. You may receive counseling support and other resources to help you quit.  Schedule a dentist appointment. At home, brush your teeth with a soft toothbrush and be gentle when you floss. Contact a health care provider if:  You have dizziness.  You have mild pelvic cramps, pelvic pressure, or nagging pain in the abdominal area.  You have persistent nausea, vomiting, or diarrhea.  You have a bad smelling vaginal discharge.  You have pain when you urinate.  You notice increased swelling in your face, hands, legs, or ankles.  You are exposed to fifth disease or chickenpox.  You are exposed to [place] measles (rubella) and have never had it. Get help right away if:  You have a fever.  You are leaking fluid from your vagina.  You have spotting or bleeding from your vagina.  You have severe abdominal cramping or pain.  You have rapid weight gain or loss.  You vomit blood or material that looks like coffee grounds.  You develop a severe headache.  You have shortness of breath.  You have any kind of trauma, such as from a fall or a car accident. Summary  The first trimester of pregnancy is from week 1 until the end of week 13 (months 1 through 3).  Your body goes through many changes during pregnancy. The changes vary from  woman to woman.  You will have routine prenatal visits. During those visits, your health care provider will examine you, discuss any test results you may have, and talk with you about how you are feeling. This information is not intended to replace advice given to you by your health care provider. Make sure you discuss any questions you have with your health care provider. Document Revised: 08/21/2017 Document Reviewed: 08/20/2016 Elsevier Patient Education  [place]. Morning Sickness  Morning sickness is  when you feel sick to your stomach (nauseous) during pregnancy. You may feel sick to your stomach and throw up (vomit). You may feel sick in the morning, but you can feel this way at any time of day. Some women feel very sick to their stomach and cannot stop throwing up (hyperemesis gravidarum). Follow these instructions at home: Medicines  Take over-the-counter and prescription medicines only as told by your doctor. Do not take any medicines until you talk with your doctor about them first.  Taking multivitamins before getting pregnant can stop or lessen the harshness of morning sickness. Eating and drinking  Eat dry toast or crackers before getting out of bed.  Eat 5 or 6 small meals a day.  Eat dry and bland foods like rice and baked potatoes.  Do not eat greasy, fatty, or spicy foods.  Have someone cook for you if the smell of food causes you to feel sick or throw up.  If you feel sick to your stomach after taking prenatal vitamins, take them at night or with a snack.  Eat protein when you need a snack. Nuts, yogurt, and cheese are good choices.  Drink fluids throughout the day.  Try ginger ale made with real ginger, ginger tea made from fresh grated ginger, or ginger candies. General instructions  Do not use any products that have nicotine or tobacco in them, such as cigarettes and e-cigarettes. If you need help quitting, ask your doctor.  Use an air purifier to  keep the air in your house free of smells.  Get lots of fresh air.  Try to avoid smells that make you feel sick.  Try: ? Wearing a bracelet that is used for seasickness (acupressure wristband). ? Going to a doctor who puts thin needles into certain body points (acupuncture) to improve how you feel. Contact a doctor if:  You need medicine to feel better.  You feel dizzy or light-headed.  You are losing weight. Get help right away if:  You feel very sick to your stomach and cannot stop throwing up.  You pass out (faint).  You have very bad pain in your belly. Summary  Morning sickness is when you feel sick to your stomach (nauseous) during pregnancy.  You may feel sick in the morning, but you can feel this way at any time of day.  Making some changes to what you eat may help your symptoms go away. This information is not intended to replace advice given to you by your health care provider. Make sure you discuss any questions you have with your health care provider. Document Revised: 08/21/2017 Document Reviewed: 10/09/2016 Elsevier Patient Education  2020 [employer].

## 2020-06-04 NOTE — MAU Note (Signed)
Is 9 wks preg. Been having some pain in lower stomach, off and on for past 3 days.  Has been having chest pain (not currently), on going problems she has always had, been told "stress and anxiety" when had work up. No bleeding.

## 2020-06-04 NOTE — MAU Provider Note (Signed)
History     CSN: [id number]  Arrival date and time: 06/04/20 1045   First Provider Initiated Contact with Patient 06/04/20 1200      Chief Complaint  Patient presents with  . Abdominal Pain   HPI   Ms.Jennifer Norman is a 24 y.o. female G2P0010 @ [redacted]w[redacted]d by certain LMP here with lower abdominal pain. The pain comes and goes. She currently rates her pain 5/10. She has not taken anything for the pain today. No bleeding. She reports loss of appetite with nausea and vomiting.  Reports vomiting 2x total since findout she was pregnant.  Hx of 7 week SAB  Has an appointment at [facility] In Oct.   OB History    Gravida  2   Para      Term      Preterm      AB  1   Living        SAB  1   TAB      Ectopic      Multiple      Live Births              Past Medical History:  Diagnosis Date  . Anxiety   . Depression   . Medical history non-contributory     Past Surgical History:  Procedure Laterality Date  . hirsch     as newborn  . NO PAST SURGERIES      Family History  Problem Relation Age of Onset  . Asthma Brother   . Healthy Mother   . Hypertension Father     Social History   Tobacco Use  . Smoking status: Never Smoker  . Smokeless tobacco: Never Used  Vaping Use  . Vaping Use: Never used  Substance Use Topics  . Alcohol use: Not Currently  . Drug use: Never    Allergies: No Known Allergies  Medications Prior to Admission  Medication Sig Dispense Refill Last Dose  . ibuprofen (ADVIL) 600 MG tablet Take 1 tablet (600 mg total) by mouth every 6 (six) hours as needed. 60 tablet 3 Past Month at Unknown time  . Multiple Vitamin (MULTIVITAMIN) tablet Take 1 tablet by mouth daily.   06/04/2020 at Unknown time  . promethazine (PHENERGAN) 25 MG tablet Take 0.5 tablets (12.5 mg total) by mouth every 6 (six) hours as needed for nausea or vomiting. 30 tablet 0 More than a month at Unknown time   Results for orders placed or performed during the  hospital encounter of 06/04/20 (from the past 48 hour(s))  Urinalysis, Routine w reflex microscopic Urine, Clean Catch     Status: Abnormal   Collection Time: 06/04/20 11:12 AM  Result Value Ref Range   Color, Urine YELLOW YELLOW   APPearance HAZY (A) CLEAR   Specific Gravity, Urine 1.023 1.005 - 1.030   pH 6.0 5.0 - 8.0   Glucose, UA NEGATIVE NEGATIVE mg/dL   Hgb urine dipstick NEGATIVE NEGATIVE   Bilirubin Urine NEGATIVE NEGATIVE   Ketones, ur NEGATIVE NEGATIVE mg/dL   Protein, ur NEGATIVE NEGATIVE mg/dL   Nitrite NEGATIVE NEGATIVE   Leukocytes,Ua NEGATIVE NEGATIVE    Comment: Performed at [facility], [address] [place], [place] [address]   US OB LESS THAN 14 WEEKS WITH OB TRANSVAGINAL  Result Date: 06/04/2020 CLINICAL DATA:  Pelvic pain EXAM: OBSTETRIC <14 WK [place] AND TRANSVAGINAL OB US TECHNIQUE: Both transabdominal and transvaginal ultrasound examinations were performed for complete evaluation of the gestation as well  as the maternal uterus, adnexal regions, and pelvic cul-de-sac. Transvaginal technique was performed to assess early pregnancy. COMPARISON:  None. FINDINGS: Intrauterine gestational sac: Visualized-single Yolk sac:  Visualized Embryo:  Visualized Cardiac Activity: Visualized Heart Rate: 160 bpm CRL:  35 mm   10 w   3 d                  [place] EDC: December 28, 2020 Note that there is extension of bowel beyond the confines of the abdomen which may be within normal limits at this gestational age. Subchorionic hemorrhage:  None visualized. Maternal uterus/adnexae: Right ovary measures 1.9 x 1.2 x 2.8 cm. Left ovary measures 2.8 x 1.9 x 1.9 cm. No extrauterine pelvic or adnexal mass. No appreciable free pelvic fluid. IMPRESSION: Single live intrauterine gestational age with estimated gestational age of 10+ weeks. Note that fetal bowel is seen outside of the abdominal cavity which may be within normal limits at this gestational age. A follow-up study in approximately 4 weeks to  confirm bowel within the fetal abdominal cavity would be reasonable. No evident subchorionic hemorrhage. No extrauterine pelvic mass or free fluid. Electronically Signed   By: Bretta Bang III M.D.   On: 06/04/2020 14:09    Review of Systems  Gastrointestinal: Positive for abdominal pain.  Genitourinary: Negative for vaginal bleeding and vaginal discharge.   Physical Exam   Blood pressure 116/66, pulse 91, temperature 99 F (37.2 C), temperature source Oral, resp. rate 17, height 5\' 9"  (1.753 m), weight 90.9 kg, last menstrual period 03/29/2020, SpO2 100 %, unknown if currently breastfeeding.  Physical Exam Constitutional:      General: She is not in acute distress.    Appearance: She is well-developed. She is not ill-appearing or toxic-appearing.  HENT:     Head: Normocephalic.  Pulmonary:     Effort: Pulmonary effort is normal.  Abdominal:     Palpations: Abdomen is soft.     Tenderness: There is no abdominal tenderness.  Skin:    General: Skin is warm.  Neurological:     General: No focal deficit present.     Mental Status: She is alert and oriented to person, place, and time.  Psychiatric:        Mood and Affect: Mood normal.    MAU Course  Procedures  MDM  BV found on Wet prep, patient without symptoms, with sperm noted Discussed 05/30/2020 in detail with the patient. Discussed findings and answered all questions.   Assessment and Plan   A:  1. Nausea and vomiting in pregnancy   2. [redacted] weeks gestation of pregnancy   3. Abdominal cramping affecting pregnancy     P:  Discharge home in stable condition F/u US in 1 month to look at bowel Return to MAU if symptoms worsen Start prenatal care First trimester warning signs discussed.  [place], NP 06/06/2020 7:53 AM

## 2020-06-05 LAB — GC/CHLAMYDIA PROBE AMP (~~LOC~~) NOT AT ARMC
Chlamydia: NEGATIVE
Comment: NEGATIVE
Comment: NORMAL
Neisseria Gonorrhea: NEGATIVE

## 2020-06-14 ENCOUNTER — Ambulatory Visit: Payer: 59

## 2020-06-15 ENCOUNTER — Ambulatory Visit: Payer: 59

## 2020-06-21 ENCOUNTER — Ambulatory Visit: Payer: 59 | Admitting: *Deleted

## 2020-06-21 ENCOUNTER — Other Ambulatory Visit: Payer: Self-pay | Admitting: *Deleted

## 2020-06-21 ENCOUNTER — Other Ambulatory Visit: Payer: Self-pay

## 2020-06-21 ENCOUNTER — Ambulatory Visit: Payer: 59 | Attending: Obstetrics and Gynecology

## 2020-06-21 ENCOUNTER — Other Ambulatory Visit (HOSPITAL_COMMUNITY): Payer: Self-pay | Admitting: Obstetrics and Gynecology

## 2020-06-21 VITALS — BP 123/71 | HR 70

## 2020-06-21 DIAGNOSIS — Z3A12 12 weeks gestation of pregnancy: Secondary | ICD-10-CM | POA: Diagnosis not present

## 2020-06-21 DIAGNOSIS — Z3A09 9 weeks gestation of pregnancy: Secondary | ICD-10-CM

## 2020-06-21 DIAGNOSIS — O219 Vomiting of pregnancy, unspecified: Secondary | ICD-10-CM | POA: Diagnosis not present

## 2020-06-21 DIAGNOSIS — O283 Abnormal ultrasonic finding on antenatal screening of mother: Secondary | ICD-10-CM

## 2020-06-21 DIAGNOSIS — R109 Unspecified abdominal pain: Secondary | ICD-10-CM | POA: Insufficient documentation

## 2020-06-21 DIAGNOSIS — R9389 Abnormal findings on diagnostic imaging of other specified body structures: Secondary | ICD-10-CM

## 2020-06-21 DIAGNOSIS — O26891 Other specified pregnancy related conditions, first trimester: Secondary | ICD-10-CM | POA: Insufficient documentation

## 2020-06-21 DIAGNOSIS — O26899 Other specified pregnancy related conditions, unspecified trimester: Secondary | ICD-10-CM

## 2020-06-21 DIAGNOSIS — O358XX1 Maternal care for other (suspected) fetal abnormality and damage, fetus 1: Secondary | ICD-10-CM | POA: Diagnosis not present

## 2020-06-28 ENCOUNTER — Other Ambulatory Visit: Payer: Self-pay

## 2020-06-28 ENCOUNTER — Telehealth (INDEPENDENT_AMBULATORY_CARE_PROVIDER_SITE_OTHER): Payer: 59 | Admitting: *Deleted

## 2020-06-28 DIAGNOSIS — Z3490 Encounter for supervision of normal pregnancy, unspecified, unspecified trimester: Secondary | ICD-10-CM

## 2020-06-28 DIAGNOSIS — O0990 Supervision of high risk pregnancy, unspecified, unspecified trimester: Secondary | ICD-10-CM | POA: Insufficient documentation

## 2020-06-28 NOTE — Progress Notes (Signed)
2979 Hatsue not connected virtually. I called [employer] mobile /home number twice and did not hear anyone pick up or a voicemail message.  I called her contact number for [place] and also did not hear anyone pick up / or voicemail. I did attempt to leave a message that I was trying to reach [facility] and you are listed as contact - please have her call our office. Demarrio Menges,RN  9:34 Emiliya not connected virtually . I again called [employer] mobile /home number and did not hear anyone pick up or a voicemail message. I attempted to leave a message I am calling about her appointment and since I can not reach you; please call us to reschedule. Eswin Worrell,RN

## 2020-07-04 ENCOUNTER — Ambulatory Visit (HOSPITAL_COMMUNITY)
Admission: EM | Admit: 2020-07-04 | Discharge: 2020-07-04 | Disposition: A | Discharge status: Home / Self Care | Payer: 59 | Attending: Family Medicine | Admitting: Family Medicine

## 2020-07-04 ENCOUNTER — Inpatient Hospital Stay (HOSPITAL_COMMUNITY)
Admission: AD | Admit: 2020-07-04 | Discharge: 2020-07-04 | Disposition: A | Discharge status: Home / Self Care | Payer: 59 | Attending: Family Medicine | Admitting: Family Medicine

## 2020-07-04 ENCOUNTER — Other Ambulatory Visit: Payer: Self-pay

## 2020-07-04 ENCOUNTER — Encounter (HOSPITAL_COMMUNITY): Payer: Self-pay

## 2020-07-04 DIAGNOSIS — O26892 Other specified pregnancy related conditions, second trimester: Secondary | ICD-10-CM | POA: Insufficient documentation

## 2020-07-04 DIAGNOSIS — R1310 Dysphagia, unspecified: Secondary | ICD-10-CM

## 2020-07-04 DIAGNOSIS — Z3A14 14 weeks gestation of pregnancy: Secondary | ICD-10-CM | POA: Diagnosis not present

## 2020-07-04 DIAGNOSIS — R102 Pelvic and perineal pain: Secondary | ICD-10-CM | POA: Diagnosis not present

## 2020-07-04 MED ORDER — CETIRIZINE HCL 10 MG PO TABS: 10.0000 mg | ORAL_TABLET | Freq: Every day | ORAL | 1 refills | Status: DC

## 2020-07-04 MED ORDER — FAMOTIDINE 20 MG PO TABS: 20.0000 mg | ORAL_TABLET | Freq: Two times a day (BID) | ORAL | 0 refills | Status: DC | PRN

## 2020-07-04 MED ORDER — FLUTICASONE PROPIONATE 50 MCG/ACT NA SUSP: 1.0000 | Freq: Two times a day (BID) | NASAL | 1 refills | Status: DC

## 2020-07-04 NOTE — ED Triage Notes (Signed)
Pt presents with ongoing vomiting and a feeling of " something needing to pass in her throat starting in her chest"; pt states she has Hx of GERD

## 2020-07-04 NOTE — ED Provider Notes (Signed)
[facility]    CSN: [id number] Arrival date & time: 07/04/20  1444      History   Chief Complaint Chief Complaint  Patient presents with  . Vomiting  . Gastroesophageal Reflux    HPI Jennifer Norman is a 24 y.o. female.   Here today for 1 day history of sensation of something stuck in her throat that is new for her. Tries to clear throat but nothing comes up. Feels like there's a thick ball of mucus there and sometimes has some burning lower down throat. Also having nausea and vomiting but states this has been chronic since becoming pregnant several months ago. Not trying anything for sxs.      Past Medical History:  Diagnosis Date  . Anxiety   . Depression   . Hirschsprung's disease   . Medical history non-contributory     Patient Active Problem List   Diagnosis Date Noted  . Supervision of low-risk pregnancy 06/28/2020    Past Surgical History:  Procedure Laterality Date  . hirsch     as newborn  . LAPAROSCOPIC ENDO-RECTAL PULL THROUGH FOR HIRSCHSPRUNG'S DISEASE    . NO PAST SURGERIES      OB History    Gravida  2   Para      Term      Preterm      AB  1   Living  0     SAB  1   TAB      Ectopic      Multiple      Live Births               Home Medications    Prior to Admission medications   Medication Sig Start Date End Date Taking? Authorizing Provider  cetirizine (ZYRTEC ALLERGY) 10 MG tablet Take 1 tablet (10 mg total) by mouth daily. 07/04/20   Particia Nearing, PA-C  Doxylamine-Pyridoxine (DICLEGIS) 10-10 MG TBEC Take 2 tablets at bedtime. If symptoms persist, add 1 tab in the AM starting on day 3. If symptoms persist, add 1 tab in the PM starting day 4. Patient not taking: Reported on 06/21/2020 06/04/20   Rasch, Victorino Dike I, NP  famotidine (PEPCID) 20 MG tablet Take 1 tablet (20 mg total) by mouth 2 (two) times daily as needed for heartburn or indigestion. 07/04/20   Particia Nearing, PA-C  fluticasone  [facility]) 50 MCG/ACT nasal spray Place 1 spray into both nostrils 2 (two) times daily. 07/04/20   Particia Nearing, PA-C  Multiple Vitamin (MULTIVITAMIN) tablet Take 1 tablet by mouth daily.    [provider]  promethazine (PHENERGAN) 25 MG tablet Take 0.5 tablets (12.5 mg total) by mouth every 6 (six) hours as needed for nausea or vomiting. Patient not taking: Reported on 06/21/2020 01/10/20   Calvert Cantor, CNM    Family History Family History  Problem Relation Age of Onset  . Asthma Brother   . Healthy Mother   . Hypertension Father     Social History Social History   Tobacco Use  . Smoking status: Never Smoker  . Smokeless tobacco: Never Used  Vaping Use  . Vaping Use: Never used  Substance Use Topics  . Alcohol use: Not Currently  . Drug use: Never     Allergies   Patient has no known allergies.   Review of Systems Review of Systems PER HPI   Physical Exam Triage Vital Signs ED Triage Vitals  Enc Vitals Group  BP 07/04/20 1539 121/65     Pulse Rate 07/04/20 1539 78     Resp 07/04/20 1539 18     Temp 07/04/20 1539 98.8 F (37.1 C)     Temp Source 07/04/20 1539 Oral     SpO2 07/04/20 1539 100 %     Weight --      Height --      Head Circumference --      Peak Flow --      Pain Score 07/04/20 1537 1     Pain Loc --      Pain Edu? --      Excl. in GC? --    No data found.  Updated Vital Signs BP 121/65 (BP Location: Right Arm)   Pulse 78   Temp 98.8 F (37.1 C) (Oral)   Resp 18   LMP 03/29/2020   SpO2 100%   Visual Acuity Right Eye Distance:   Left Eye Distance:   Bilateral Distance:    Right Eye Near:   Left Eye Near:    Bilateral Near:     Physical Exam Vitals and nursing note reviewed.  Constitutional:      Appearance: Normal appearance. She is not ill-appearing.  HENT:     Head: Atraumatic.     Right Ear: Tympanic membrane normal.     Left Ear: Tympanic membrane normal.     Nose:     Comments: Nasal  mucosa erythematous and edematous    Mouth/Throat:     Mouth: Mucous membranes are moist.     Pharynx: Oropharynx is clear. Posterior oropharyngeal erythema present.  Eyes:     Extraocular Movements: Extraocular movements intact.     Conjunctiva/sclera: Conjunctivae normal.  Neck:     Comments: No thyromegaly on palpation Cardiovascular:     Rate and Rhythm: Normal rate and regular rhythm.     Heart sounds: Normal heart sounds.  Pulmonary:     Effort: Pulmonary effort is normal.     Breath sounds: Normal breath sounds.  Abdominal:     General: Bowel sounds are normal.     Palpations: Abdomen is soft.  Musculoskeletal:        General: Normal range of motion.     Cervical back: Normal range of motion and neck supple.  Lymphadenopathy:     Cervical: No cervical adenopathy.  Skin:    General: Skin is warm and dry.  Neurological:     Mental Status: She is alert and oriented to person, place, and time.  Psychiatric:        Mood and Affect: Mood normal.        Thought Content: Thought content normal.        Judgment: Judgment normal.      UC Treatments / Results  Labs (all labs ordered are listed, but only abnormal results are displayed) Labs Reviewed - No data to display  EKG   Radiology No results found.  Procedures Procedures (including critical care time)  Medications Ordered in UC Medications - No data to display  Initial Impression / Assessment and Plan / UC Course  I have reviewed the triage vital signs and the nursing notes.  Pertinent labs & imaging results that were available during my care of the patient were reviewed by me and considered in my medical decision making (see chart for details).     No obstruction obvious on exam, tonsils, thyroid, lymph nodes not edematous. She does endorse a hx of seasonal allergies, possibly  irritation from that or reflux. Could also be anxiety related globus sensation. Will start allergy regimen and pepcid prn to r/o  these causes and close f/u with OBGYN recommended. Return if sxs worsening or failing to improve.  Final Clinical Impressions(s) / UC Diagnoses   Final diagnoses:  Dysphagia, unspecified type   Discharge Instructions   None    ED Prescriptions    Medication Sig Dispense Auth. Provider   fluticasone (FLONASE) 50 MCG/ACT nasal spray Place 1 spray into both nostrils 2 (two) times daily. 16 g Particia Nearing, [place]   cetirizine (ZYRTEC ALLERGY) 10 MG tablet Take 1 tablet (10 mg total) by mouth daily. 30 tablet Particia Nearing, [place]   famotidine (PEPCID) 20 MG tablet Take 1 tablet (20 mg total) by mouth 2 (two) times daily as needed for heartburn or indigestion. 60 tablet Particia Nearing, [place]     PDMP not reviewed this encounter.   Particia Nearing, [place] 07/04/20 1621

## 2020-07-04 NOTE — ED Provider Notes (Signed)
MSE was initiated and I personally evaluated the patient and placed orders (if any) at  5:21 PM on July 04, 2020.  The patient appears stable so that the remainder of the MSE may be completed by another provider.  Patient reports that she is having lower pelvic pain and was sent by her OB doctor to get a pelvic ultrasound for 14-week gestation pregnancy.  Patient was seen earlier at urgent care for reports of reflux-like symptoms.  She denies she is having any active problems right now with this symptom.  She anticipated being sent to the emergency department for pelvic ultrasound.  Patient denies she is having any vaginal bleeding, drainage or discharge.  Patient is alert and nontoxic.  She is clinically well in appearance.  She is sitting in the chair comfortable in appearance.  No respiratory distress.  Mental status clear.  Patient will be transferred to MAU for complaint of pelvic pain and first trimester pregnancy.   Arby Barrette, MD 07/04/20 1723

## 2020-07-04 NOTE — MAU Note (Signed)
.   Jennifer Norman is a 24 y.o. at [redacted]w[redacted]d here in MAU reporting: lower abdominal cramping since Sunday night. Denies any vaginal bleeding . LMP: 03/29/20 Onset of complaint: Sunday Pain score: 7 Vitals:   07/04/20 1811 07/04/20 1812  BP:  (!) 111/55  Pulse:  69  Resp:  16  Temp:  98.8 F (37.1 C)  SpO2: 100%      FHT: Lab orders placed from triage: UA

## 2020-07-05 ENCOUNTER — Encounter: Payer: Self-pay | Admitting: Obstetrics and Gynecology

## 2020-07-05 ENCOUNTER — Other Ambulatory Visit (HOSPITAL_COMMUNITY)
Admission: RE | Admit: 2020-07-05 | Discharge: 2020-07-05 | Disposition: A | Discharge status: Home / Self Care | Payer: 59 | Source: Ambulatory Visit | Attending: Obstetrics and Gynecology | Admitting: Obstetrics and Gynecology

## 2020-07-05 ENCOUNTER — Ambulatory Visit (INDEPENDENT_AMBULATORY_CARE_PROVIDER_SITE_OTHER): Payer: 59 | Admitting: Obstetrics and Gynecology

## 2020-07-05 VITALS — BP 113/58 | HR 73 | Wt 203.6 lb

## 2020-07-05 DIAGNOSIS — Z3492 Encounter for supervision of normal pregnancy, unspecified, second trimester: Secondary | ICD-10-CM | POA: Insufficient documentation

## 2020-07-05 DIAGNOSIS — B3731 Acute candidiasis of vulva and vagina: Secondary | ICD-10-CM

## 2020-07-05 DIAGNOSIS — B373 Candidiasis of vulva and vagina: Secondary | ICD-10-CM

## 2020-07-05 DIAGNOSIS — Z6830 Body mass index (BMI) 30.0-30.9, adult: Secondary | ICD-10-CM | POA: Insufficient documentation

## 2020-07-05 DIAGNOSIS — Z87738 Personal history of other specified (corrected) congenital malformations of digestive system: Secondary | ICD-10-CM

## 2020-07-05 LAB — POCT URINALYSIS DIP (DEVICE)
Bilirubin Urine: NEGATIVE
Glucose, UA: NEGATIVE mg/dL
Hgb urine dipstick: NEGATIVE
Ketones, ur: NEGATIVE mg/dL
Leukocytes,Ua: NEGATIVE
Nitrite: NEGATIVE
Protein, ur: NEGATIVE mg/dL
Specific Gravity, Urine: 1.02 (ref 1.005–1.030)
Urobilinogen, UA: 0.2 mg/dL (ref 0.0–1.0)
pH: 7 (ref 5.0–8.0)

## 2020-07-05 NOTE — Patient Instructions (Addendum)
Your Due Date is April 14th Today you are 14 weeks and 0 days   [facility] of Social [facility] [address] [place], [place] [address] [phone]   or  www.https://hall.info/ **SNAP/EBT/ Other nutritional benefits  [facility] [address] [place], [place], [place] [address] [phone]  or  https://king.net/ **WIC for  women who are pregnant and postpartum, infants and children up to 63 years old  [employer] [employer] [facility] Pant[facility] [address] [place], [place] [address] [phone]   or   www.theblessedtable.org  **Food pantry  [facility] [address] [place], [place], [place] [address] [phone]   or   https://brotherkolbes.godaddysites.com  **Emergency food and prepared meals  [address] [place] of [place] Food Pantry [address], [place], [place] [address] [phone]   or   www.cedargrovetop.us **Food pantry  [facility] Food Pantry [address], [place], [place] [address] [phone]   or   www.GolfingFamily.no **Food pantry  [facility] Food Pantry [address] [place], [place] [address] [phone] **Food pantry  [facility] [address], [place], [place] [address] [phone]   or   www.greensborourbanministry.org  Tour manager and prepared meals  [facility] [place] [address] [place], [address] [place], [place], [place] [address] VerifiedMovies.gl  **Food pantry  [employer] [employer] Food Pantry [address], [place], [place] [address] [phone]   or   www.lbcnow.org  **Food pantry  [facility] [address], [place], [place] [address] [phone]   or   WorkingMBA.co.nz **Food pantry, nutrition education,  gardening activities  [facility] Food Pantry [address] [place], [place] [address] [phone] **Food pantry  [facility] [address], [place], [place] [address] [phone]   or   www.salvationarmyofgreensboro.Roddie Mc of [place] [address], [place], [place] [address] [phone]   or   http://senior-resources-guilford.org Dole Food on Wheels Program  [facility] [address], [place], [place] [address] [phone]   or   www.stmattchurch.com  **Food pantry  [place] [facility] [address] [place], [place] [address] [phone]   or   vandaliapresbyterianchurch.org **Food pantry  [employer] Food Resources  [employer] Pantry [address] [place], [place], [place] [address] [phone]   or   www.FightingMatch.com.ee Food Engineer, production of Churches [address] Leonard Schwartz [place], [place] [address] [phone] **Food pantry  [facility] Food Resources   Department of [facility] [address], [place], [place] [address] [phone]   or   www.co.[place].us/ph/  [facility]) [address] [place], [place] [address] [phone]   or   https://www.rios-wells.com/ **WIC for pregnant and postpartum women, infants and children up to 62 years old  [facility]ionate Pantry [address], [place], [place] [address] [phone] **Food pantry  [facility] Food Pantry [address], [place], [place] [address] [phone]   or   emerywoodbaptistchurch.com *Food pantry  [employer] Food Pantry [address], [place], [place] [address] [phone]   or   www.fcchighpoint.Gerre ScullFood pantry  [facility] [address], [place], [place] [address] [phone]   or   http://www.bird.biz/ **Food pantry  [facility] Food Pantry [address], [place], [place] [address] [phone]  or   www.facebook.com/KBCI1 Electronics engineer of [place] Food Pantry [address], [place], [place] [address] [phone]   or   www.abbottscreek.org [employer] of [place] [phone]   **Delivers meals  [facility] [address] [place], [place] [address] [phone]   or   nbfgm.sundaystreamwebsites.com  Tree surgeon of [employer] [address] [place], [place] [address] [phone]   or   www.odm-hp.org  **Food pantry  [address] [place] Food Pantry [address] [place], [place] [address] [phone]   or   R2live.tv **Food pantry  [facility] [address], [place], [place] [address] [phone]   or   WrestlingMonthly.pl **Emergency food and pet food  Sen[facility] [employer]ults [employer] [place] [address], [place], [place] [address] [phone]   or   www.senioradults.org **Congregate and delivered meals to older adults  [place] [employer] of Greater [employer] [address] [place], [place] [address] [phone]   or   https://www.miller-montoya.com/ **Back Pack Program for elementary school students  [facility] [address] [place], [place] [address] [phone]   or   www.wardstreetcommunityresources.org **Food pantry  [facility] [address], [place], [place] [address] [phone]   or   ResumeQuery.com.ee **Emergency food, nutrition classes, food budgeting  Food Resources [place]  Department of Social [place]  [address], [place], [place] [address] [phone]  or   www.co.[place].us/pview.aspx?id=14850&catid=407 **SNAP/Other nutrition benefits  [facility] of Health [facility] [address], [place], [place] [address] [phone]   or  FutureSponsors.be  **SNAP/Other nutrition benefits  [facility] Department of Public [facility] Services [address] [place], [place], [place] [address] [phone]  or  http://www.rockinghamcountypublichealth.org **WIC for pregnant and postpartum women, infants and children up to 61 years old  Aging, Disability and Transit [facility] [address], [place], [place] [address] [phone]  or www.BlackjackCoupons.com.br **Prepared meals for older adults  [facility] Food Pantry [address], [place], [place] [address] [phone]  or  http://caldwell-sandoval.com/ **Food pantry  [facility] [address] [place], [place] [address] [phone]   or   https://www.handsofgod.org/  Tour manager  [employer] in [place] Food Pantry [address] [place], [place] [address] [phone] **Food pantry  [facility] for [facility] Ret[employer] [address] [place], [place], [place] [address] [phone]   or   www.ci.[place].Rome.us/government/parks_and_recreation/senior_center/index.php **Congregate meal for older adults  [facility] [address] [place], [place] [address] [phone]   or www.reidsvilleoutreachcenter.org  **Food pantry  [facility] [address] [place], [place] [address] [phone]   or   TelevisionEnthusiast.fr **Food pantry

## 2020-07-05 NOTE — Progress Notes (Signed)
New OB Note  07/05/2020   Clinic: [facility]  Chief Complaint: NOB  Transfer of Care Patient: no  History of Present Illness: Ms. Sonn is a 24 y.o. G2P0010 @ 14/0 weeks (EDC 4/14, based on Patient's last menstrual period was 03/29/2020.=10wk u/s  Preg complicated by has Supervision of low-risk pregnancy; BMI 30.0-30.9,adult; and History of Hirschsprung's disease on their problem list.   Patient is doing well and no problems or issues today  ROS: A 12-point review of systems was performed and negative, except as stated in the above HPI.  OBGYN History: As per HPI. OB History  Gravida Para Term Preterm AB Living  2       1 0  SAB TAB Ectopic Multiple Live Births  1            # Outcome Date GA Lbr Len/2nd Weight Sex Delivery Anes PTL Lv  2 Current           1 SAB 01/2020             Past Medical History: Past Medical History:  Diagnosis Date  . Anxiety   . Depression   . Hirschsprung's disease   . Medical history non-contributory     Past Surgical History: Past Surgical History:  Procedure Laterality Date  . hirsch     as newborn  . LAPAROSCOPIC ENDO-RECTAL PULL THROUGH FOR HIRSCHSPRUNG'S DISEASE      Family History:  Family History  Problem Relation Age of Onset  . Asthma Brother   . Healthy Mother   . Hypertension Father     Social History:  Social History   Socioeconomic History  . Marital status: Single    Spouse name: Not on file  . Number of children: Not on file  . Years of education: Not on file  . Highest education level: Not on file  Occupational History  . Not on file  Tobacco Use  . Smoking status: Never Smoker  . Smokeless tobacco: Never Used  Vaping Use  . Vaping Use: Never used  Substance and Sexual Activity  . Alcohol use: Not Currently  . Drug use: Never  . Sexual activity: Yes    Birth control/protection: None  Other Topics Concern  . Not on file  Social History Narrative  . Not on file    Social Determinants of Health   Financial Resource Strain:   . Difficulty of Paying Living Expenses: Not on file  Food Insecurity:   . Worried About Programme researcher, broadcasting/film/video in the Last Year: Not on file  . Ran Out of Food in the Last Year: Not on file  Transportation Needs:   . Lack of Transportation (Medical): Not on file  . Lack of Transportation (Non-Medical): Not on file  Physical Activity:   . Days of Exercise per Week: Not on file  . Minutes of Exercise per Session: Not on file  Stress:   . Feeling of Stress : Not on file  Social Connections:   . Frequency of Communication with Friends and Family: Not on file  . Frequency of Social Gatherings with Friends and Family: Not on file  . Attends Religious Services: Not on file  . Active Member of Clubs or Organizations: Not on file  . Attends Banker Meetings: Not on file  . Marital Status: Not on file  Intimate Partner Violence:   . Fear of Current or Ex-Partner: Not on file  . Emotionally Abused: Not on  file  . Physically Abused: Not on file  . Sexually Abused: Not on file    Allergy: No Known Allergies  Health Maintenance:  Mammogram Up to Date: not applicable  Current Outpatient Medications: Zyrtec, pepcid, flonase, prenatal  Physical Exam:   BP (!) 113/58   Pulse 73   LMP 03/29/2020  There is no height or weight on file to calculate BMI. Contractions: Not present Vag. Bleeding: None. Fundal height: not applicable FHTs: 160s  General appearance: Well nourished, well developed female in no acute distress.  Neck:  Supple, normal appearance, and no thyromegaly  Cardiovascular: S1, S2 normal, no murmur, rub or gallop, regular rate and rhythm Respiratory:  Clear to auscultation bilateral. Normal respiratory effort Abdomen: positive bowel sounds and no masses, hernias; diffusely non tender to palpation, non distended Breasts: no breast s/s. Neuro/Psych:  Normal mood and affect.  Skin:  Warm and dry.   Lymphatic:  No inguinal lymphadenopathy.   Pelvic exam: is not limited by body habitus EGBUS: within normal limits, Vagina: within normal limits and with no blood in the vault, +white cottage cheese like d/c. Cervix: normal appearing cervix without discharge or lesions, closed/long/high, Uterus:  enlarged, c/w 14 week size, and Adnexa:  normal adnexa and no mass, fullness, tenderness  Laboratory: reviewed  Imaging:  H/o normal NT u/s  Assessment: pt doing well  Plan: 1. Encounter for supervision of low-risk pregnancy in second trimester Routine care. Offer afp nv. Declined shots today - Cytology - PAP( [place]) - CBC/D/Plt+RPR+Rh+ABO+Rub Ab... - Culture, OB Urine - Genetic Screening - Hemoglobin A1c - [place] MFM OB COMP + 14 WK - TSH - Comprehensive metabolic panel  2. Vulvovaginal candidiasis Pt already bought OTC cream   3. BMI 30  4. Maternal h/o hirschsprung's and surgery as a child   Problem list reviewed and updated.  Follow up in 4 weeks.  >50% of 30 min visit spent on counseling and coordination of care.     Cornelia Copa MD Attending Center for [facility])

## 2020-07-05 NOTE — Progress Notes (Signed)
Food insecurity noted- resources given, taken to pantry Geneive Sandstrom,RN

## 2020-07-05 NOTE — Progress Notes (Signed)
[employer] shipped via [place].    Addison Naegeli, RN

## 2020-07-06 LAB — CBC/D/PLT+RPR+RH+ABO+RUB AB...
Antibody Screen: NEGATIVE
Basophils Absolute: 0 10*3/uL (ref 0.0–0.2)
Basos: 1 %
EOS (ABSOLUTE): 0.1 10*3/uL (ref 0.0–0.4)
Eos: 2 %
HCV Ab: <0.1 s/co ratio (ref 0.0–0.9)
HIV Screen 4th Generation wRfx: NONREACTIVE
Hematocrit: 36.9 % (ref 34.0–46.6)
Hemoglobin: 12.2 g/dL (ref 11.1–15.9)
Hepatitis B Surface Ag: NEGATIVE
Immature Grans (Abs): 0 10*3/uL (ref 0.0–0.1)
Immature Granulocytes: 0 %
Lymphocytes Absolute: 1.6 10*3/uL (ref 0.7–3.1)
Lymphs: 42 %
MCH: 29.3 pg (ref 26.6–33.0)
MCHC: 33.1 g/dL (ref 31.5–35.7)
MCV: 89 fL (ref 79–97)
Monocytes Absolute: 0.3 10*3/uL (ref 0.1–0.9)
Monocytes: 8 %
Neutrophils Absolute: 1.9 10*3/uL (ref 1.4–7.0)
Neutrophils: 47 %
Platelets: 191 10*3/uL (ref 150–450)
RBC: 4.16 x10E6/uL (ref 3.77–5.28)
RDW: 13.2 % (ref 11.7–15.4)
RPR Ser Ql: NONREACTIVE
Rh Factor: POSITIVE
Rubella Antibodies, IGG: 2.07 index (ref >0.99)
WBC: 3.9 10*3/uL (ref 3.4–10.8)

## 2020-07-06 LAB — HEMOGLOBIN A1C
Est. average glucose Bld gHb Est-mCnc: 108 mg/dL
Hgb A1c MFr Bld: 5.4 % (ref 4.8–5.6)

## 2020-07-06 LAB — CYTOLOGY - PAP
Chlamydia: NEGATIVE
Comment: NEGATIVE
Comment: NORMAL
Diagnosis: NEGATIVE
Neisseria Gonorrhea: NEGATIVE

## 2020-07-06 LAB — COMPREHENSIVE METABOLIC PANEL
ALT: 10 IU/L (ref 0–32)
AST: 14 IU/L (ref 0–40)
Albumin/Globulin Ratio: 1.8 (ref 1.2–2.2)
Albumin: 4.1 g/dL (ref 3.9–5.0)
Alkaline Phosphatase: 36 IU/L — ABNORMAL LOW (ref 44–121)
BUN/Creatinine Ratio: 15 (ref 9–23)
BUN: 10 mg/dL (ref 6–20)
Bilirubin Total: 0.4 mg/dL (ref 0.0–1.2)
CO2: 21 mmol/L (ref 20–29)
Calcium: 9.2 mg/dL (ref 8.7–10.2)
Chloride: 103 mmol/L (ref 96–106)
Creatinine, Ser: 0.68 mg/dL (ref 0.57–1.00)
GFR calc Af Amer: 144 mL/min/{1.73_m2} (ref >59)
GFR calc non Af Amer: 125 mL/min/{1.73_m2} (ref >59)
Globulin, Total: 2.3 g/dL (ref 1.5–4.5)
Glucose: 83 mg/dL (ref 65–99)
Potassium: 3.9 mmol/L (ref 3.5–5.2)
Sodium: 136 mmol/L (ref 134–144)
Total Protein: 6.4 g/dL (ref 6.0–8.5)

## 2020-07-06 LAB — TSH: TSH: 5.97 u[IU]/mL — ABNORMAL HIGH (ref 0.450–4.500)

## 2020-07-06 LAB — HCV INTERPRETATION

## 2020-07-07 LAB — CULTURE, OB URINE

## 2020-07-07 LAB — URINE CULTURE, OB REFLEX

## 2020-07-10 ENCOUNTER — Telehealth: Payer: Self-pay | Admitting: Lactation Services

## 2020-07-10 ENCOUNTER — Encounter: Payer: Self-pay | Admitting: Obstetrics and Gynecology

## 2020-07-10 DIAGNOSIS — R7989 Other specified abnormal findings of blood chemistry: Secondary | ICD-10-CM | POA: Insufficient documentation

## 2020-07-10 NOTE — Telephone Encounter (Signed)
Called Andre at [employer] to add Free T4 to labs drawn on 10/14. [facility] to investigate to see if enough blood available to run and if not patient will need to come in for another blood draw. Awaiting direction from [facility].

## 2020-07-12 ENCOUNTER — Encounter: Payer: Self-pay | Admitting: General Practice

## 2020-07-17 ENCOUNTER — Encounter: Payer: Self-pay | Admitting: General Practice

## 2020-07-31 ENCOUNTER — Encounter: Payer: Self-pay | Admitting: Advanced Practice Midwife

## 2020-07-31 ENCOUNTER — Ambulatory Visit (INDEPENDENT_AMBULATORY_CARE_PROVIDER_SITE_OTHER): Payer: 59 | Admitting: Advanced Practice Midwife

## 2020-07-31 ENCOUNTER — Ambulatory Visit: Payer: 59 | Attending: Obstetrics and Gynecology

## 2020-07-31 ENCOUNTER — Other Ambulatory Visit: Payer: Self-pay

## 2020-07-31 VITALS — BP 116/64 | HR 75 | Wt 209.6 lb

## 2020-07-31 DIAGNOSIS — O0990 Supervision of high risk pregnancy, unspecified, unspecified trimester: Secondary | ICD-10-CM

## 2020-07-31 DIAGNOSIS — R9389 Abnormal findings on diagnostic imaging of other specified body structures: Secondary | ICD-10-CM | POA: Diagnosis present

## 2020-07-31 DIAGNOSIS — Z3A17 17 weeks gestation of pregnancy: Secondary | ICD-10-CM

## 2020-07-31 DIAGNOSIS — Z363 Encounter for antenatal screening for malformations: Secondary | ICD-10-CM

## 2020-07-31 DIAGNOSIS — O99342 Other mental disorders complicating pregnancy, second trimester: Secondary | ICD-10-CM | POA: Diagnosis not present

## 2020-07-31 DIAGNOSIS — O2692 Pregnancy related conditions, unspecified, second trimester: Secondary | ICD-10-CM | POA: Diagnosis not present

## 2020-07-31 DIAGNOSIS — F419 Anxiety disorder, unspecified: Secondary | ICD-10-CM

## 2020-07-31 DIAGNOSIS — O359XX0 Maternal care for (suspected) fetal abnormality and damage, unspecified, not applicable or unspecified: Secondary | ICD-10-CM

## 2020-07-31 MED ORDER — BUTALBITAL-APAP-CAFFEINE 50-325-40 MG PO TABS: 1.0000 | ORAL_TABLET | Freq: Four times a day (QID) | ORAL | 0 refills | Status: DC | PRN

## 2020-07-31 NOTE — Progress Notes (Signed)
  Pt states is having a lot of headaches.

## 2020-07-31 NOTE — Patient Instructions (Signed)
COVID-19 Vaccination if You Are Pregnant or Breastfeeding ° °The Society for Maternal-Fetal Medicine (SMFM) and other pregnancy experts recommend that pregnant and lactating people be vaccinated against COVID-19. The Centers for Disease Control and Prevention (CDC) also recommend vaccination for “all people aged 23 years and older, including people who are pregnant, breastfeeding, trying to get pregnant now, or might become pregnant in the future.” Vaccination is the best way to reduce the risks of COVID-19 infection and COVID-related complications for both you and your baby. ° °Three vaccines are available to prevent COVID-19: °• The two-dose Pfizer vaccine for people 12 years and older--APPROVED by the [place] Food and Drug Administration on May 14, 2020 °• The two-dose Moderna vaccine for people 18 years and older--AUTHORIZED for emergency use °• The one-dose Johnson & Johnson vaccine for people 18 years and older (you may also see this vaccine referred to as the “Janssen vaccine”)--AUTHORIZED for emergency use ° °For those receiving the Pfizer and Moderna vaccines, the second dose is given 21 days (Pfizer) and 28 days (Moderna) after the first dose. The Johnson & Johnson vaccine is only one dose. ° °Information for Pregnant Individuals °If you are pregnant or planning to become pregnant and are thinking about getting vaccinated, consider talking with your health care professional about the vaccine.  ° °To help with your decision, you should consider the following key points: °Anyone can get the COVID vaccines free of charge regardless of immigration status or whether they have insurance. You may be asked for your social security number, but it is  °NOT required to get vaccinated. ° °What are benefits of getting the COVID-19 vaccines during pregnancy?  °• The vaccines can help protect you from getting COVID-19. With the two-dose vaccines, you must get both doses for maximum effectiveness. It’s not yet known how  long protection lasts. ° °• Another potential benefit is that getting the vaccine while pregnant may help you pass antiCOVID-19 antibodies to your baby. In numerous studies of vaccinated moms, antibodies were found in the umbilical cord blood of babies and in the mother’s breastmilk. ° °• The CDC, along with other federal partners, are monitoring people who have been vaccinated for serious side effects. So far, more than 139,000 pregnant people have been °vaccinated. No unexpected pregnancy or fetal problems have occurred. There have been no reports of any increased risk of pregnancy loss, growth problems, or birth defects. ° °• A safe vaccine is generally considered one in which the benefits of being vaccinated outweigh the risks. The current vaccines are not live vaccines. There is only a very small  °chance that they cross the placenta, so it’s unlikely that they even reach the fetus. Vaccines don’t affect future fertility. The only people who should NOT get vaccinated are those who have had a severe allergic reaction to vaccines in the past or any vaccine ingredients. ° °• Side effects may occur in the first 3 days after getting vaccinated.1 These include mild to moderate fever, headache, and muscle aches. Side effects may be worse after the second dose of the Pfizer and Moderna vaccines. Fever should be avoided during pregnancy,especially in the first trimester. Those who develop a fever after vaccination can take °acetaminophen (Tylenol). This medication is safe to use during pregnancy and does not  affect how the vaccine works.  ° °What are the known risks of getting COVID-19 during pregnancy?  °About 1 to 3 per 1,000 pregnant women with COVID-19 will develop severe disease. Compared with those who   aren’t pregnant, pregnant people infected by the COVID-19 virus: °• Are 3 times more likely to need ICU care °• Are 2 to 3 times more likely to need advanced life support and a breathing tube  °• Have a small  increased risk of dying due to COVID-19 °They may also be at increased risk of stillbirth and preterm birth. ° °What is my risk of getting COVID-19?  °Your risk of getting COVID-19 depends on the chance that you will come into contact with another infected person. The risk may be higher if you live in a community where there is a lot of COVID-19 infection or work in healthcare or another high-contact setting.  ° °What is my risk for severe complications if I get COVID-19?  °Data show that older pregnant women; those with preexisting health conditions, such as a body mass index higher than 35 kg/m2, diabetes, and heart disorders; and Black or Latinx women have an especially increased risk of severe disease and death from COVID-19. °  °If you still have questions about the vaccines or need more information, ask your health care provider or go to the Centers for Disease Control and Prevention’s COVID-19 vaccine webpage.  ° °An Update on the Johnson & Johnson Vaccine  ° °In April 2021, the FDA and CDC called for a brief pause to use of the Johnson & Johnson vaccine. They did so after reports of a severe side effect in a very small number of women younger than age 50 following vaccination. This side effect, called thrombosis with thrombocytopenia syndrome (TTS), causes blood clots (thrombosis) combined with low levels of platelets (thrombocytopenia). ° °TTS following the Johnson & Johnson vaccine is extremely rare. At the time of this update, it has occurred in only 7 people per 1 million Johnson & Johnson shots given. According to the CDC, being on hormonal birth control (the pill, patch, or ring), pregnancy, breastfeeding, or being recently pregnant does not make you more likely to develop TTS after getting the Johnson & Johnson vaccine. The pause was lifted on January 13, 2020, after the FDA and CDC determined that the known benefits of the Johnson & Johnson vaccine far outweigh the risks.  ° °Health care professionals  have been alerted to the possibility of this side effect in people who have received the Johnson & Johnson vaccine. National organizations continue to recommend COVID-19 vaccination with any of the vaccines for pregnant women. All women younger than age 50 years, whether pregnant, breastfeeding, or not, should be aware of the very rare risk of TTS after getting the Johnson & Johnson vaccine. The Pfizer and Moderna vaccines don’t have this risk. If you get the Johnson & Johnson vaccine,  °seek medical help right away if you develop any of the following symptoms within 3 weeks of getting your shot: ° °• Severe or persistent headaches or blurred vision °• Shortness of breath °• Chest pain °• Leg swelling °• Persistent abdominal pain °• Easy bruising or tiny blood spots under the skin beyond the injection site ° °Experts continue to collect health and safety information from pregnant people who have been vaccinated. If you have questions about vaccination during pregnancy, visit the CDC website or talk to your health care professional. Information for Breastfeeding/Lactating Individuals The Society for Maternal-Fetal Medicine and other pregnancy experts recommend COVID-19 vaccination for people who are breastfeeding/lactating. You don’t have to delay or stop  °breastfeeding just because you get vaccinated.  ° °Getting Vaccinated  °You can get vaccinated at   any time during pregnancy. The CDC is committed to monitoring the vaccine’s safety for all individuals. Your health professional or vaccine clinic may give you information about enrolling in the v-safe after vaccination health checker (see the box below).Even after you’re fully vaccinated, it is important to follow the CDC’s guidance for wearing a mask indoors in areas where there are substantial or high rates of COVID-19 infection.  ° °What Happens When You Enroll in v-Safe?  °The v-safe after vaccination health checker program lets the CDC check in with you after  your vaccination. At sign-up, you can indicate that you are pregnant. Once you do that, expect the following: °• Someone may call you from the v-safe program to ask initial questions and get more information. °• You may be asked to enroll in the vaccine pregnancy registry, which is collecting information about any effects of the vaccine during pregnancy. This is a great way to help scientists monitor the vaccine’s safety and effectiveness.  ° °References °1. Oliver SE, Gargano JW, Marin M, Wallace M, Curran KG, Chamberland M, et al. The Advisory Committee on Immunization Practices’ Interim Recommendation for Use of Pfizer-BioNTech  °COVID-19 Vaccine -- [place], December 2020. MMWR Morbidity and Mortality Weekly Report 2020;69. °2. FDA Briefing Document. Janssen Ad26.COV2.S Vaccine for the Prevention of COVID-19. 2021. Accessed Nov 25, 2019; Available from: https://www.fda.gov/media/146217/download °3. PFIZER-BIONTECH COVID-19 VACCINE [package insert] [place]: Pfizer and [place], [place]: Biontech;2020. °4. FDA Briefing Document. Moderna COVID-19 Vaccine. 2020. Accessed 2020, Dec 18; Available from: https://www.fda.gov/media/144434/download  °5. Gray KJ, Bordt EA, Atyeo C, Deriso E, Akinwunmi B, Young N, et al. COVID-19 vaccine response in pregnant and lactating women: a cohort study. Am J Obstet Gynecol 2021 Mar 24. °6. Panagiotakopoulos L, Myers TR, Gee J, Lipkind HS, Kharbanda EO, Ryan DS, et al. SARS-CoV-2 Infection Among Hospitalized Pregnant Women: Reasons for Admission and Pregnancy  °Characteristics - [facility], March 1-Feb 19, 2019. MMWR Morb Mortal Wkly Rep 2020 Sep 23;69(38):1355-9. °7. Zambrano LD, Ellington S, Strid P, Galang RR, Oduyebo T, Tong VT, et al. Update: Characteristics of Symptomatic Women of Reproductive Age with Laboratory-Confirmed SARSCoV-2 Infection by Pregnancy Status - [place], January 22-June 25, 2019. MMWR Morb Mortal Wkly Rep 2020 Nov  6;69(44):1641-7. °8. Delahoy MJ, Whitaker M, O'Halloran A, Chai SJ, Kirley PD, Alden N, et al. Characteristics and Maternal and Birth Outcomes of Hospitalized Pregnant Women with Laboratory-Confirmed COVID-19 - COVID-NET, 13 States, March 1-May 14, 2019. MMWR Morb Mortal Wkly Rep 2020 Sep 25;69(38):1347-54. ° °

## 2020-07-31 NOTE — Progress Notes (Signed)
   PRENATAL VISIT NOTE  Subjective:  Jennifer Norman is a 24 y.o. G2P0010 at [redacted]w[redacted]d being seen today for ongoing prenatal care.  She is currently monitored for the following issues for this high-risk pregnancy and has Supervision of high risk pregnancy, antepartum; BMI 30.0-30.9,adult; History of Hirschsprung's disease; and Elevated TSH on their problem list.  Patient reports no complaints.  Contractions: Not present. Vag. Bleeding: None.  Movement: Absent. Denies leaking of fluid.   The following portions of the patient's history were reviewed and updated as appropriate: allergies, current medications, past family history, past medical history, past social history, past surgical history and problem list.   Objective:   Vitals:   07/31/20 0953  BP: 116/64  Pulse: 75  Weight: 209 lb 9.6 oz (95.1 kg)    Fetal Status: Fetal Heart Rate (bpm): 153   Movement: Absent     General:  Alert, oriented and cooperative. Patient is in no acute distress.  Skin: Skin is warm and dry. No rash noted.   Cardiovascular: Normal heart rate noted  Respiratory: Normal respiratory effort, no problems with respiration noted  Abdomen: Soft, gravid, appropriate for gestational age.  Pain/Pressure: Present     Pelvic: Cervical exam deferred        Extremities: Normal range of motion.  Edema: None  Mental Status: Normal mood and affect. Normal behavior. Normal judgment and thought content.   Assessment and Plan:  Pregnancy: G2P0010 at [redacted]w[redacted]d 1. Supervision of high risk pregnancy, antepartum - AFP, Serum, Open Spina Bifida - T4, free  2. [redacted] weeks gestation of pregnancy  3. Headaches in pregnancy -  Fioricet PRN #[id number] sent to pharmacy on file   Preterm labor symptoms and general obstetric precautions including but not limited to vaginal bleeding, contractions, leaking of fluid and fetal movement were reviewed in detail with the patient. Please refer to After Visit Summary for other counseling recommendations.    Return in about 4 weeks (around 08/28/2020).  Future Appointments  Date Time Provider Department Center  07/31/2020 10:45 AM [facility] US[facility]    Thressa Sheller DNP, CNM  07/31/20  10:26 AM

## 2020-08-01 ENCOUNTER — Other Ambulatory Visit: Payer: Self-pay

## 2020-08-01 DIAGNOSIS — Z658 Other specified problems related to psychosocial circumstances: Secondary | ICD-10-CM

## 2020-08-01 NOTE — Progress Notes (Addendum)
Pt expressed interest in behavioral health services during conversation with prenatal navigator. Requested appt with [facility]. Front office notified to schedule.

## 2020-08-02 LAB — AFP, SERUM, OPEN SPINA BIFIDA
AFP MoM: 1.27
AFP Value: 46.8 ng/mL
Gest. Age on Collection Date: 17.5 weeks
Maternal Age At EDD: 23.2 yr
OSBR Risk 1 IN: 10000
Test Results:: NEGATIVE
Weight: 209 [lb_av]

## 2020-08-02 LAB — T4, FREE
Free T4: 1.04 ng/dL (ref 0.82–1.77)
Free T4: 1.11 ng/dL (ref 0.82–1.77)

## 2020-08-02 LAB — SPECIMEN STATUS REPORT

## 2020-08-07 NOTE — BH Specialist Note (Signed)
Pt did not arrive to video visit and did not answer the phone ; No voicemail, phone went blank at first call; second attempt no ring, so unable to leave voice message; left MyChart message for patient.

## 2020-08-08 ENCOUNTER — Ambulatory Visit: Payer: 59 | Admitting: Clinical

## 2020-08-08 DIAGNOSIS — Z91199 Patient's noncompliance with other medical treatment and regimen due to unspecified reason: Secondary | ICD-10-CM

## 2020-08-08 DIAGNOSIS — Z5329 Procedure and treatment not carried out because of patient's decision for other reasons: Secondary | ICD-10-CM

## 2020-08-27 ENCOUNTER — Other Ambulatory Visit: Payer: Self-pay

## 2020-08-27 ENCOUNTER — Inpatient Hospital Stay (HOSPITAL_COMMUNITY)
Admission: AD | Admit: 2020-08-27 | Discharge: 2020-08-28 | Disposition: A | Discharge status: Home / Self Care | Payer: 59 | Attending: Obstetrics and Gynecology | Admitting: Obstetrics and Gynecology

## 2020-08-27 ENCOUNTER — Encounter (HOSPITAL_COMMUNITY): Payer: Self-pay | Admitting: Obstetrics and Gynecology

## 2020-08-27 DIAGNOSIS — Z3A21 21 weeks gestation of pregnancy: Secondary | ICD-10-CM | POA: Diagnosis not present

## 2020-08-27 DIAGNOSIS — R10812 Left upper quadrant abdominal tenderness: Secondary | ICD-10-CM | POA: Diagnosis not present

## 2020-08-27 DIAGNOSIS — O99012 Anemia complicating pregnancy, second trimester: Secondary | ICD-10-CM | POA: Diagnosis not present

## 2020-08-27 DIAGNOSIS — D649 Anemia, unspecified: Secondary | ICD-10-CM | POA: Insufficient documentation

## 2020-08-27 DIAGNOSIS — R109 Unspecified abdominal pain: Secondary | ICD-10-CM | POA: Insufficient documentation

## 2020-08-27 DIAGNOSIS — O99013 Anemia complicating pregnancy, third trimester: Secondary | ICD-10-CM

## 2020-08-27 DIAGNOSIS — Z79899 Other long term (current) drug therapy: Secondary | ICD-10-CM | POA: Diagnosis not present

## 2020-08-27 DIAGNOSIS — O26892 Other specified pregnancy related conditions, second trimester: Secondary | ICD-10-CM | POA: Insufficient documentation

## 2020-08-27 DIAGNOSIS — R102 Pelvic and perineal pain: Secondary | ICD-10-CM | POA: Diagnosis not present

## 2020-08-27 DIAGNOSIS — O26899 Other specified pregnancy related conditions, unspecified trimester: Secondary | ICD-10-CM

## 2020-08-27 LAB — URINALYSIS, ROUTINE W REFLEX MICROSCOPIC
Bilirubin Urine: NEGATIVE
Glucose, UA: NEGATIVE mg/dL
Hgb urine dipstick: NEGATIVE
Ketones, ur: NEGATIVE mg/dL
Leukocytes,Ua: NEGATIVE
Nitrite: NEGATIVE
Protein, ur: NEGATIVE mg/dL
Specific Gravity, Urine: 1.014 (ref 1.005–1.030)
pH: 7 (ref 5.0–8.0)

## 2020-08-27 LAB — CBC
HCT: 33.5 % — ABNORMAL LOW (ref 36.0–46.0)
Hemoglobin: 10.6 g/dL — ABNORMAL LOW (ref 12.0–15.0)
MCH: 29.3 pg (ref 26.0–34.0)
MCHC: 31.6 g/dL (ref 30.0–36.0)
MCV: 92.5 fL (ref 80.0–100.0)
Platelets: 170 10*3/uL (ref 150–400)
RBC: 3.62 MIL/uL — ABNORMAL LOW (ref 3.87–5.11)
RDW: 13.2 % (ref 11.5–15.5)
WBC: 5.6 10*3/uL (ref 4.0–10.5)
nRBC: 0 % (ref 0.0–0.2)

## 2020-08-27 LAB — WET PREP, GENITAL
Clue Cells Wet Prep HPF POC: NONE SEEN
Sperm: NONE SEEN
Trich, Wet Prep: NONE SEEN
Yeast Wet Prep HPF POC: NONE SEEN

## 2020-08-27 NOTE — MAU Provider Note (Signed)
History     CSN: [id number]  Arrival date and time: 08/27/20 1705   First Provider Initiated Contact with Patient 08/27/20 2208      Chief Complaint  Patient presents with  . Abdominal Pain   24 y.o. G2P0010 @21 .4 wks presenting with LAP. Sx started 5 days ago. Pain is bilateral. Describes as intermittent and sharp. Worse with standing. Better when lying down. Not currently having pain. Denies VB or vaginal discharge. No fevers. No GI sx. No urinary sx.   OB History    Gravida  2   Para      Term      Preterm      AB  1   Living  0     SAB  1   TAB      Ectopic      Multiple      Live Births              Past Medical History:  Diagnosis Date  . Anxiety   . Depression   . Hirschsprung's disease   . Medical history non-contributory     Past Surgical History:  Procedure Laterality Date  . hirsch     as newborn  . LAPAROSCOPIC ENDO-RECTAL PULL THROUGH FOR HIRSCHSPRUNG'S DISEASE      Family History  Problem Relation Age of Onset  . Asthma Brother   . Healthy Mother   . Hypertension Father     Social History   Tobacco Use  . Smoking status: Never Smoker  . Smokeless tobacco: Never Used  Vaping Use  . Vaping Use: Never used  Substance Use Topics  . Alcohol use: Not Currently  . Drug use: Never    Allergies: No Known Allergies  No medications prior to admission.    Review of Systems  Constitutional: Negative for fever.  Gastrointestinal: Positive for abdominal pain. Negative for constipation, diarrhea, nausea and vomiting.  Genitourinary: Negative for dysuria, hematuria, urgency, vaginal bleeding and vaginal discharge.   Physical Exam   Blood pressure 124/69, pulse 80, temperature 98.7 F (37.1 C), temperature source Oral, resp. rate 20, height 5\' 9"  (1.753 m), weight 98.1 kg, last menstrual period 03/29/2020, SpO2 100 %, unknown if currently breastfeeding.  Physical Exam Vitals and nursing note reviewed. Exam conducted with a  chaperone present.  Constitutional:      Appearance: Normal appearance.  HENT:     Head: Normocephalic and atraumatic.  Cardiovascular:     Rate and Rhythm: Normal rate.  Pulmonary:     Effort: Pulmonary effort is normal. No respiratory distress.  Abdominal:     General: There is no distension.     Palpations: Abdomen is soft.     Tenderness: There is abdominal tenderness in the right lower quadrant.  Genitourinary:    Comments: VE: closed/long Musculoskeletal:        General: Normal range of motion.     Cervical back: Normal range of motion.  Skin:    General: Skin is warm and dry.  Neurological:     General: No focal deficit present.     Mental Status: She is alert and oriented to person, place, and time.  Psychiatric:        Mood and Affect: Mood normal.        Behavior: Behavior normal.   FHT 145  Results for orders placed or performed during the hospital encounter of 08/27/20 (from the past 24 hour(s))  Urinalysis, Routine w reflex microscopic Urine, Clean Catch  Status: Abnormal   Collection Time: 08/27/20  5:44 PM  Result Value Ref Range   Color, Urine YELLOW YELLOW   APPearance HAZY (A) CLEAR   Specific Gravity, Urine 1.014 1.005 - 1.030   pH 7.0 5.0 - 8.0   Glucose, UA NEGATIVE NEGATIVE mg/dL   Hgb urine dipstick NEGATIVE NEGATIVE   Bilirubin Urine NEGATIVE NEGATIVE   Ketones, ur NEGATIVE NEGATIVE mg/dL   Protein, ur NEGATIVE NEGATIVE mg/dL   Nitrite NEGATIVE NEGATIVE   Leukocytes,Ua NEGATIVE NEGATIVE  Wet prep, genital     Status: Abnormal   Collection Time: 08/27/20 10:16 PM   Specimen: Vaginal  Result Value Ref Range   Yeast Wet Prep HPF POC NONE SEEN NONE SEEN   Trich, Wet Prep NONE SEEN NONE SEEN   Clue Cells Wet Prep HPF POC NONE SEEN NONE SEEN   WBC, Wet Prep HPF POC MANY (A) NONE SEEN   Sperm NONE SEEN   CBC     Status: Abnormal   Collection Time: 08/27/20 10:40 PM  Result Value Ref Range   WBC 5.6 4.0 - 10.5 K/uL   RBC 3.62 (L) 3.87 - 5.11  MIL/uL   Hemoglobin 10.6 (L) 12.0 - 15.0 g/dL   HCT 73.4 (L) 36 - 46 %   MCV 92.5 80.0 - 100.0 fL   MCH 29.3 26.0 - 34.0 pg   MCHC 31.6 30.0 - 36.0 g/dL   RDW 19.3 79.0 - 24.0 %   Platelets 170 150 - 400 K/uL   nRBC 0.0 0.0 - 0.2 %   MAU Course  Procedures  MDM Labs ordered and reviewed. No signs of acute process, UTI, or PTL. Pain likely RL, discussed comfort measures. Stable for discharge home.  Assessment and Plan   1. [redacted] weeks gestation of pregnancy   2. Pain of round ligament during pregnancy   3. Anemia during pregnancy in third trimester    Discharge home Follow [facility] at [facility] as scheduled PTL precautions  Allergies as of 08/28/2020   No Known Allergies     Medication List    TAKE these medications   butalbital-acetaminophen-caffeine 50-325-40 MG tablet Commonly known as: FIORICET Take 1-2 tablets by mouth every 6 (six) hours as needed for headache.   cetirizine 10 MG tablet Commonly known as: ZyrTEC Allergy Take 1 tablet (10 mg total) by mouth daily.   Doxylamine-Pyridoxine 10-10 MG Tbec Commonly known as: Diclegis Take 2 tablets at bedtime. If symptoms persist, add 1 tab in the AM starting on day 3. If symptoms persist, add 1 tab in the PM starting day 4.   famotidine 20 MG tablet Commonly known as: PEPCID Take 1 tablet (20 mg total) by mouth 2 (two) times daily as needed for heartburn or indigestion.   fluticasone 50 MCG/ACT nasal spray Commonly known as: FLONASE Place 1 spray into both nostrils 2 (two) times daily.   PrePLUS 27-1 MG Tabs Take 1 tablet by mouth daily.   promethazine 25 MG tablet Commonly known as: PHENERGAN Take 0.5 tablets (12.5 mg total) by mouth every 6 (six) hours as needed for nausea or vomiting.       Donette Larry, CNM 08/28/2020, 12:21 AM

## 2020-08-27 NOTE — Discharge Instructions (Signed)

## 2020-08-27 NOTE — MAU Note (Signed)
Was talking with the nurse yesterday, was having some lower abd pain.  Couple days last wk was feeling clammy.  The nurse recommended she come over and be seen right away.denies fever.

## 2020-08-28 ENCOUNTER — Encounter: Payer: Self-pay | Admitting: Nurse Practitioner

## 2020-08-28 ENCOUNTER — Ambulatory Visit (INDEPENDENT_AMBULATORY_CARE_PROVIDER_SITE_OTHER): Payer: 59 | Admitting: Clinical

## 2020-08-28 ENCOUNTER — Telehealth (INDEPENDENT_AMBULATORY_CARE_PROVIDER_SITE_OTHER): Payer: 59 | Admitting: Nurse Practitioner

## 2020-08-28 DIAGNOSIS — O26899 Other specified pregnancy related conditions, unspecified trimester: Secondary | ICD-10-CM

## 2020-08-28 DIAGNOSIS — O0990 Supervision of high risk pregnancy, unspecified, unspecified trimester: Secondary | ICD-10-CM

## 2020-08-28 DIAGNOSIS — R519 Headache, unspecified: Secondary | ICD-10-CM

## 2020-08-28 DIAGNOSIS — O0992 Supervision of high risk pregnancy, unspecified, second trimester: Secondary | ICD-10-CM

## 2020-08-28 DIAGNOSIS — Z658 Other specified problems related to psychosocial circumstances: Secondary | ICD-10-CM

## 2020-08-28 DIAGNOSIS — F439 Reaction to severe stress, unspecified: Secondary | ICD-10-CM

## 2020-08-28 DIAGNOSIS — O99342 Other mental disorders complicating pregnancy, second trimester: Secondary | ICD-10-CM

## 2020-08-28 DIAGNOSIS — Z3A21 21 weeks gestation of pregnancy: Secondary | ICD-10-CM

## 2020-08-28 DIAGNOSIS — O26892 Other specified pregnancy related conditions, second trimester: Secondary | ICD-10-CM

## 2020-08-28 LAB — GC/CHLAMYDIA PROBE AMP (~~LOC~~) NOT AT ARMC
Chlamydia: NEGATIVE
Comment: NEGATIVE
Comment: NORMAL
Neisseria Gonorrhea: NEGATIVE

## 2020-08-28 MED ORDER — CYCLOBENZAPRINE HCL 10 MG PO TABS: 10.0000 mg | ORAL_TABLET | Freq: Three times a day (TID) | ORAL | 1 refills | Status: DC | PRN

## 2020-08-28 NOTE — Progress Notes (Signed)
OBSTETRICS PRENATAL VIRTUAL VISIT ENCOUNTER NOTE  [facility]   I connected with Jennifer MirzaAviance Monrreal on 08/28/20 at 10:15 AM EST by MyChart Video Encounter at home and verified that I am speaking with the correct person using two identifiers.   I discussed the limitations, risks, security and privacy concerns of performing an evaluation and management service virtually and the availability of in person appointments. I also discussed with the patient that there may be a patient responsible charge related to this service. The patient expressed understanding and agreed to proceed.  Subjective:  Jennifer Norman is a 24 y.o. G2P0010 at 4267w5d being seen today for ongoing prenatal care.  She is currently monitored for the following issues for this high-risk pregnancy and has Supervision of high risk pregnancy, antepartum; BMI 30.0-30.9,adult; History of Hirschsprung's disease; and Elevated TSH on their problem list.  Patient reports stress and headaches.  Contractions: Not present. Vag. Bleeding: None.  Movement: Present. Denies any leaking of fluid.   The following portions of the patient's history were reviewed and updated as appropriate: allergies, current medications, past family history, past medical history, past social history, past surgical history and problem list.   Objective:  There were no vitals filed for this visit.  Fetal Status:     Movement: Present     General:  Alert, oriented and cooperative. Patient is in no acute distress.  Respiratory: Normal respiratory effort, no problems with respiration noted  Mental Status: Normal mood and affect. Normal behavior. Normal judgment and thought content.  Rest of physical exam deferred due to type of encounter  Imaging: US MFM OB COMP + 14 WK  Result Date: 07/31/2020 ----------------------------------------------------------------------  OBSTETRICS REPORT                        (Signed Final 07/31/2020 12:25 pm) ---------------------------------------------------------------------- Patient Info  ID #:       [id number]                          D.O.B.:  21-Oct-1995 (23 yrs)  Name:       Jennifer Norman                   Visit Date: 07/31/2020 10:49 am ---------------------------------------------------------------------- Performed By  Attending:        Lin Landsmanorenthian Booker      Ref. Address:     [address]                    [place]                                                             [address] [place],                                                             [place] [id number]  Performed By:     Reinaldo RaddleBrenda Shaw            [facility]  Referred By:      Duane Lope NP ---------------------------------------------------------------------- Orders  #  Description                           Code        Ordered By  1  [place] MFM OB COMP + 14 WK                76805.01    [facility] ----------------------------------------------------------------------  #  Order #                     Accession #                Episode #  1  [id number]                   [id number]                 [id number] ---------------------------------------------------------------------- Indications  Encounter for antenatal screening for          Z36.3  malformations (LR Nips)  Fetal abnormality - suspected (midgut          O35.9XX0  herniation)  Medical complication of pregnancy              O26.90  (Hirschsprungs)  Anxiety during pregnancy, second trimester     O99.342, F41.9  [redacted] weeks gestation of pregnancy                Z3A.17 ---------------------------------------------------------------------- Vital Signs                                                 Height:        5'9"  ---------------------------------------------------------------------- Fetal Evaluation  Num Of Fetuses:         1  Fetal Heart Rate(bpm):  145  Cardiac Activity:       Observed  Presentation:           Cephalic  Placenta:               Fundal  P. Cord Insertion:  Visualized, central  Amniotic Fluid  AFI FV:      Within normal limits                              Largest Pocket(cm)                              4.33 ---------------------------------------------------------------------- Biometry  BPD:      44.9  mm     G. Age:  19w 4d         98  %    CI:        81.49   %    70 - 86                                                          FL/HC:      16.1   %    15.8 - 18  HC:       157   mm     G. Age:  18w 4d         83  %    HC/AC:      1.18        1.07 - 1.29  AC:      133.3  mm     G. Age:  18w 6d         82  %    FL/BPD:     56.1   %  FL:       25.2  mm     G. Age:  17w 4d         41  %    FL/AC:      18.9   %    20 - 24  HUM:      26.4  mm     G. Age:  18w 2d         74  %  CER:      18.5  mm     G. Age:  18w 2d         77  %  NFT:       4.1  mm  LV:          4  mm  CM:          4  mm  Est. FW:     235  gm      0 lb 8 oz     82  % ---------------------------------------------------------------------- OB History  Gravidity:    2          SAB:   1  Living:       0 ---------------------------------------------------------------------- Gestational Age  Clinical EDD:  17w 5d                                        EDD:   01/03/21  U/S Today:     18w 5d  EDD:   12/27/20  Best:          17w 5d     Det. By:  Clinical EDD             EDD:   01/03/21 ---------------------------------------------------------------------- Anatomy  Cranium:               Appears normal         LVOT:                   Appears normal  Cavum:                 Appears normal         Aortic Arch:            Appears normal  Ventricles:            Appears normal         Ductal Arch:            Appears normal   Choroid Plexus:        Appears normal         Diaphragm:              Appears normal  Cerebellum:            Appears normal         Stomach:                Appears normal, left                                                                        sided  Posterior Fossa:       Appears normal         Abdomen:                Appears normal  Nuchal Fold:           Appears normal         Abdominal Wall:         Appears nml (cord                                                                        insert, abd wall)  Face:                  Orbits and profile     Cord Vessels:           Appears normal (3                         previously seen                                vessel cord)  Lips:                  Appears normal         Kidneys:  Appear normal  Palate:                Not well visualized    Bladder:                Appears normal  Thoracic:              Appears normal         Spine:                  Appears normal  Heart:                 Appears normal         Upper Extremities:      Appears normal                         (4CH, axis, and                         situs)  RVOT:                  Appears normal         Lower Extremities:      Appears normal  Other:  Hands and feet visualized. 5th digit visualized. Heels visualized. ---------------------------------------------------------------------- Cervix Uterus Adnexa  Cervix  Length:           3.49  cm.  Normal appearance by transabdominal scan.  Uterus  No abnormality visualized.  Right Ovary  Not visualized.  Left Ovary  Within normal limits.  Cul De Sac  No free fluid seen.  Adnexa  No adnexal mass visualized. ---------------------------------------------------------------------- Impression  Single intrauterine pregnancy with measurements consistent  with dates  There is good fetal movement and amniotic fluid volume.  Normal anatomy observed today. ---------------------------------------------------------------------- Recommendations  Follow  up as clinically indicated. ----------------------------------------------------------------------               Lin Landsman, MD Electronically Signed Final Report   07/31/2020 12:25 pm ----------------------------------------------------------------------   Assessment and Plan:  Pregnancy: G2P0010 at [redacted]w[redacted]d 1. Supervision of high risk pregnancy, antepartum Questions about daily workouts including weight lifting - has decreased the intensity from before pregnancy.  Reviewed possibility of injury due to loosening of joints in pregnancy.   Cannot take blood pressure at the time of the call but reports she will send it later in MyChart.  2. Stress Reports lots of stress to the point she is worried about the effect on the baby.  Will have behavioral heath person call her today.  Client agrees to the call. Client wondering about whether to continue the pregnancy.  3. Headache in pregnancy, antepartum Not as many headaches.  Has taken a couple of fioricet.  Advised that repeated use of fioricet may cause rebound headaches.  Reviewed that taking flexeril may help the headaches without causing rebound.  Will send prescription to pharmacy and client will try.   Preterm labor symptoms and general obstetric precautions including but not limited to vaginal bleeding, contractions, leaking of fluid and fetal movement were reviewed in detail with the patient. I discussed the assessment and treatment plan with the patient. The patient was provided an opportunity to ask questions and all were answered. The patient agreed with the plan and demonstrated an understanding of the instructions. The patient was advised to call back or seek an in-person office evaluation/go to [facility]U at [facility] for any urgent or  concerning symptoms. Please refer to After Visit Summary for other counseling recommendations.   I provided 10 minutes of face-to-face time during this encounter.  Return in about 3 weeks  (around 09/18/2020) for in person ROB.  Future Appointments  Date Time Provider Department Center  09/19/2020 10:45 AM [facility]    Currie Paris, NP Center for [employer], [facility]

## 2020-08-28 NOTE — Patient Instructions (Addendum)
Center for [facility] at [facility] for Women [place], [place] [address] [phone] (main office) [phone] [facility] office)  [email or web address] Diplomatic Services operational officer Tour of [employer], register for childbirth education classes, etc.)   [place] Child Psychologist, counselling  (Childcare options, Early childcare development, etc.) [email or web address]      BRAINSTORMING  Develop a Plan Goals: . Provide a way to start conversation about your new life with a baby . Assist parents in recognizing and using resources within their reach . Help pave the way before birth for an easier period of transition afterwards.  Make a list of the following information to keep in a central location: . Full name of Mom and Partner: _____________________________________________ . 78 full name and Date of Birth: ___________________________________________ . Home Address: ___________________________________________________________ ________________________________________________________________________ . Home Phone: ____________________________________________________________ . Parents' cell numbers: _____________________________________________________ ________________________________________________________________________ . Name and contact info for OB: ______________________________________________ . Name and contact info for Pediatrician:________________________________________ . Contact info for Lactation Consultants: ________________________________________  REST and SLEEP *You each need at least 4-5 hours of uninterrupted sleep every day. Write specific names and contact information.* . How are you going to rest in the postpartum period? While partner's home? When partner returns to work? When you both return to work? Marland Kitchen Where will your baby sleep? Marland Kitchen Who is available to help during the day? Evening? Night? . Who could move in for a period to help support you? Marland Kitchen  What are some ideas to help you get enough sleep? __________________________________________________________________________________________________________________________________________________________________________________________________________________________________________ NUTRITIOUS FOOD AND DRINK *Plan for meals before your baby is born so you can have healthy food to eat during the immediate postpartum period.* . Who will look after breakfast? Lunch? Dinner? List names and contact information. Brainstorm quick, healthy ideas for each meal. . What can you do before baby is born to prepare meals for the postpartum period? . How can others help you with meals? Marland Kitchen Which grocery stores provide online shopping and delivery? Marland Kitchen Which restaurants offer take-out or delivery options? ______________________________________________________________________________________________________________________________________________________________________________________________________________________________________________________________________________________________________________________________________________________________________________________________________  CARE FOR MOM *It's important that mom is cared for and pampered in the postpartum period. Remember, the most important ways new mothers need care are: sleep, nutrition, gentle exercise, and time off.* . Who can come take care of mom during this period? Make a list of people with their contact information. . List some activities that make you feel cared for, rested, and energized? Who can make sure you have opportunities to do these things? . Does mom have a space of her very own within your home that's just for her? Make a "[facility]" where she can be comfortable, rest, and renew herself daily.  ______________________________________________________________________________________________________________________________________________________________________________________________________________________________________________________________________________________________________________________________________________________________________________________________________    CARE FOR AND FEEDING BABY *Knowledgeable and encouraging people will offer the best support with regard to feeding your baby.* . Educate yourself and choose the best feeding option for your baby. . Make a list of people who will guide, support, and be a resource for you as your care for and feed your baby. (Friends that have breastfed or are currently breastfeeding, lactation consultants, breastfeeding support groups, etc.) . Consider a postpartum doula. (These websites can give you information: dona.org & BuyingShow.es) . Seek out local breastfeeding resources like the breastfeeding support group at [employer] or [employer]. ______________________________________________________________________________________________________________________________________________________________________________________________________________________________________________________________________________________________________________________________________________________________________________________________________  Verner Chol AND ERRANDS . Who can help with a thorough cleaning before baby is born? . Make a list of people who will help with housekeeping and chores, like laundry, light cleaning, dishes, bathrooms, etc. . Who can run some errands  for you? Marland Kitchen What can you do to make sure you are stocked with basic supplies before baby is born? . Who is going to do the shopping?  ______________________________________________________________________________________________________________________________________________________________________________________________________________________________________________________________________________________________________________________________________________________________________________________________________     Family Adjustment *Nurture yourselves.it helps parents be more loving and allows for better bonding with their child.* . What sorts of things do you and partner enjoy doing together? Which activities help you to connect and strengthen your relationship? Make a list of those things. Make a list of people whom you trust to care for your baby so you can have some time together as a couple. . What types of things help partner feel connected to Mom? Make a list. . What needs will partner have in order to bond with baby? . Other children? Who will care for them when you go into labor and while you are in the hospital? . Think about what the needs of your older children might be. Who can help you meet those needs? In what ways are you helping them prepare for bringing baby home? List some specific strategies you have for family adjustment. _______________________________________________________________________________________________________________________________________________________________________________________________________________________________________________________________________________________________________________________________________________  SUPPORT *Someone who can empathize with experiences normalizes your problems and makes them more bearable.* . Make a list of other friends, neighbors, and/or co-workers you know with infants (and small children, if applicable) with whom you can connect. . Make a list of local or online support groups, mom groups, etc. in which you can be involved.  ______________________________________________________________________________________________________________________________________________________________________________________________________________________________________________________________________________________________________________________________________________________________________________________________________  Childcare Plans . Investigate and plan for childcare if mom is returning to work. . Talk about mom's concerns about her transition back to work. . Talk about partner's concerns regarding this transition.  Mental Health *Your mental health is one of the highest priorities for a pregnant or postpartum mom.* . 1 in 5 women experience anxiety and/or depression from the time of conception through the first year after birth. . Postpartum Mood Disorders are the #1 complication of pregnancy and childbirth and the suffering experienced by these mothers is not necessary! These illnesses are temporary and respond well to treatment, which often includes self-care, social support, talk therapy, and medication when needed. . Women experiencing anxiety and depression often say things like: "I'm supposed to be happy.why do I feel so sad?", "Why can't I snap out of it?", "I'm having thoughts that scare me." . There is no need to be embarrassed if you are feeling these symptoms: o Overwhelmed, anxious, angry, sad, guilty, irritable, hopeless, exhausted but can't sleep o You are NOT alone. You are NOT to blame. With help, you WILL be well. . Where can I find help? Medical professionals such as your OB, midwife, gynecologist, family practitioner, primary care provider, pediatrician, or mental health providers; [facility] support groups: Feelings After Birth, Breastfeeding Support Group, Baby and Me Group, and Fit 4 Two exercise classes. . You have permission to ask for help. It will confirm your feelings, validate your  experiences, share/learn coping strategies, and gain support and encouragement as you heal. You are important! BRAINSTORM . Make a list of local resources, including resources for mom and for partner. . Identify support groups. . Identify people to call late at night - include names and contact info. . Talk with partner about perinatal mood and anxiety disorders. . Talk with your OB, midwife, and doula about baby blues and about perinatal mood and anxiety disorders. . Talk with your pediatrician about perinatal mood and anxiety disorders.   Support & Sanity Savers   What do you really need?  Marland Kitchen  Basics . In preparing for a new baby, many expectant parents spend hours shopping for baby clothes, decorating the nursery, and deciding which car seat to buy. Yet most don't think much about what the reality of parenting a newborn will be like, and what they need to make it through that. So, here is the advice of experienced parents. We know you'll read this, and think "they're exaggerating, I don't really need that." Just trust [place] on these, OK? Plan for all of this, and if it turns out you don't need it, come back and teach [place] how you did it!  Satira Anis (Once baby's survival needs are met, make sure you attend to your own survival needs!) . Sleep . An average newborn sleeps 16-18 hours per day, over 6-7 sleep periods, rarely more than three hours at a time. It is normal and healthy for a newborn to wake throughout the night... but really hard on parents!! . Naps. Prioritize sleep above any responsibilities like: cleaning house, visiting friends, running errands, etc.  Sleep whenever baby sleeps. If you can't nap, at least have restful times when baby eats. The more rest you get, the more patient you will be, the more emotionally stable, and better at solving problems.  . Food . You may not have realized it would be difficult to eat when you have a newborn. Yet, when we talk to . countless new  parents, they say things like "it may be 2:00 pm when I realize I haven't had breakfast yet." Or "every time we sit down to dinner, baby needs to eat, and my food gets cold, so I don't bother to eat it." . Finger food. Before your baby is born, stock up with one months' worth of food that: 1) you can eat with one hand while holding a baby, 2) doesn't need to be prepped, 3) is good hot or cold, 4) doesn't spoil when left out for a few hours, and 5) you like to eat. Think about: nuts, dried fruit, Clif bars, pretzels, jerky, gogurt, baby carrots, apples, bananas, crackers, cheez-n-crackers, string cheese, hot pockets or frozen burritos to microwave, garden burgers and breakfast pastries to put in the toaster, yogurt drinks, etc. . Restaurant Menus. Make lists of your favorite restaurants & menu items. When family/friends want to help, you can give specific information without much thought. They can either bring you the food or send gift cards for just the right meals. Rosaura Carpenter Meals.  Take some time to make a few meals to put in the freezer ahead of time.  Easy to freeze meals can be anything such as soup, lasagna, chicken pie, or spaghetti sauce. . Set up a Meal Schedule.  Ask friends and family to sign up to bring you meals during the first few weeks of being home. (It can be passed around at baby showers!) You have no idea how helpful this will be until you are in the throes of parenting.  https://hamilton-woodard.[email or web address]/ is a great website to check out. . Emotional Support . Know who to call when you're stressed out. Parenting a newborn is very challenging work. There are times when it totally overwhelms your normal coping abilities. EVERY NEW PARENT NEEDS TO HAVE A PLAN FOR WHO TO CALL WHEN THEY JUST CAN'T COPE ANY MORE. (And it has to be someone other than the baby's other parent!) Before your baby is born, come up with at least one person you can call for support - write their phone number  down and post it on the  refrigerator. Marland Kitchen Anxiety & Sadness. Baby blues are normal after pregnancy; however, there are more severe types of anxiety & sadness which can occur and should not be ignored.  They are always treatable, but you have to take the first step by reaching out for help. [facility] offers a "Mom Talk" group which meets every Tuesday from 10 am - 11 am.  This group is for new moms who need support and connection after their babies are born.  Call [phone].  Marland Kitchen Really, Really Helpful (Plan for them! Make sure these happen often!!) . Physical Support with Taking Care of Yourselves . Asking friends and family. Before your baby is born, set up a schedule of people who can come and visit and help out (or ask a friend to schedule for you). Any time someone says "let me know what I can do to help," sign them up for a day. When they get there, their job is not to take care of the baby (that's your job and your joy). Their job is to take care of you!  . Postpartum doulas. If you don't have anyone you can call on for support, look into postpartum doulas:  professionals at helping parents with caring for baby, caring for themselves, getting breastfeeding started, and helping with household tasks. www.padanc.org is a helpful website for learning about doulas in our area. . Peer Support / Parent Groups . Why: One of the greatest ideas for new parents is to be around other new parents. Parent groups give you a chance to share and listen to others who are going through the same season of life, get a sense of what is normal infant development by watching several babies learn and grow, share your stories of triumph and struggles with empathetic ears, and forgive your own mistakes when you realize all parents are learning by trial and error. . Where to find: There are many places you can meet other new parents throughout our community.  [facility] offers the following classes for new moms and their little ones:  Baby  and Me (Birth to [place]) and Breastfeeding Support Group. Go to www.conehealthybaby.com or call [phone] for more information. . Time for your Relationship . It's easy to get so caught up in meeting baby's immediate needs that it's hard to find time to connect with your partner, and meet the needs of your relationship. It's also easy to forget what "quality time with your partner" actually looks like. If you take your baby on a date, you'd be amazed how much of your couple time is spent feeding the baby, diapering the baby, admiring the baby, and talking about the baby. . Dating: Try to take time for just the two of you. Babysitter tip: Sometimes when moms are breastfeeding a newborn, they find it hard to figure out how to schedule outings around baby's unpredictable feeding schedules. Have the babysitter come for a three hour period. When she comes over, if baby has just eaten, you can leave right away, and come back in two hours. If baby hasn't fed recently, you start the date at home. Once baby gets hungry and gets a good feeding in, you can head out for the rest of your date time. . Date Nights at Home: If you can't get out, at least set aside one evening a week to prioritize your relationship: whenever baby dozes off or doesn't have any immediate needs, spend a little time focusing on each  other. . Potential conflicts: The main relationship conflicts that come up for new parents are: issues related to sexuality, financial stresses, a feeling of an unfair division of household tasks, and conflicts in parenting styles. The more you can work on these issues before baby arrives, the better!  Clint Guy and Frills (Don't forget these. and don't feel guilty for indulging in them!) . Everyone has something in life that is a fun little treat that they do just for themselves. It may be: reading the morning paper, or going for a daily jog, or having coffee with a friend once a week, or going to a movie on Friday  nights, or fine chocolates, or bubble baths, or curling up with a good book. . Unless you do fun things for yourself every now and then, it's hard to have the energy for fun with your baby. Whatever your "special" treats are, make sure you find a way to continue to indulge in them after your baby is born. These special moments can recharge you, and allow you to return to baby with a new joy   PERINATAL MOOD DISORDERS: [place]   Emergency and Crisis Resources:  If you are an imminent risk to self or others, are experiencing intense personal distress, and/or have noticed significant changes in activities of daily living, call:  . 911 . [facility]: 250-137-2127 . Mobile Crisis: 6208018824 . National Suicide Hotline: 204-354-3209 Or visit the following crisis centers: . Local Emergency Departments . [facility]: [address], [place]. Hours: 8:30AM-5PM. Insurance Accepted: Medicaid, Medicare, and Uninsured.  Marland Kitchen [facility]  [address], [place] Mon-Friday 8am-3pm  [phone]                                                                                    Non-Crisis Resources: To identify specific providers that are covered by your insurance, contact your insurance company or local agencies: [place]: [phone] [place]: [phone] [place]: [phone] Postpartum Support International- [place] [phone]                                                      Outpatient therapy and medication management providers:  [facility] [phone] Hours: 9AM-5PM  Insurance Accepted: Alben Spittle, Lorella Nimrod, Freddrick March, [place], Medicare  [facility] ([place]) [phone] Hours: 8AM-5PM  nsurance Accepted: All insurances EXCEPT AARP, [place], [place], and [place]: [phone]             Hours: 8AM-8PM Insurance Accepted: Cristal Ford, Freddrick March, [place], Medicare, [phone] [facility]: [phone] Hours: 8:30AM-7PM Insurance Accepted: Cristal Ford, Medicaid, Medicare, Tricare, [employer] Counseling:  [place] Accepted:  Holland Falling, Lorella Nimrod, [employer], [place], [place] [phone] Hours: 9AM-5:30PM  Insurance Accepted: Alben Spittle, Charlotte Crumb, and [place], Medicare, [facility]:  [phone] Hours: 9am-5pm Insurance Accepted: BCBS; they do not accept Medicaid/Medicare [place]: [phone] Hours: 9am-9pm Insurance Accepted: All major insurance including Medicaid and Medicare [facility]: [phone] Hours: 9AM-5:30PM Insurance Accepted: All insurances EXCEPT Medicaid and Medicare. [facility]: [place]: [phone] [place]:  [place] (support for children in the NICU and/or with special needs), [employer] [place] [employer]: [phone]                                                                                     Online Resources: Postpartum Support International: http://Constantine-berg.[email or web address]/  800-944-4PPD 2Moms Supporting Moms:  www.momssupportingmoms.net    /Emotional [employer] and Websites Here are a few free apps meant to help you to help yourself.  To find, try searching on the internet to see if the app is offered on Apple/Android devices. If your first choice doesn't come up on  your device, the good news is that there are many choices! Play around with different apps to see which ones are helpful to you.    Calm This is an app meant to help increase calm feelings. Includes info, strategies, and tools for tracking your feelings.      Calm Harm  This app is meant to help with self-harm. Provides many 5-minute or 15-min coping strategies for doing instead of hurting yourself.       [place] is a problem-solving tool to help deal with emotions and cope with stress you encounter wherever you are.      MindShift This app can help people cope with anxiety. Rather than trying to avoid anxiety, you can make an important shift and face it.      MY3  MY3 features a support system, safety plan and resources with the goal of offering a tool to use in a time of need.       My Life My Voice  This mood journal offers a simple solution for tracking your thoughts, feelings and moods. Animated emoticons can help identify your mood.       Relax Melodies Designed to help with sleep, on this app you can mix sounds and meditations for relaxation.  Smiling Mind Smiling Mind is meditation made easy: it's a simple tool that helps put a smile on your mind.        Stop, Breathe & Think  A friendly, simple guide for people through meditations for mindfulness and compassion.  Stop, Breathe and Think Kids Enter your current feelings and choose a "mission" to help you cope. Offers videos for certain moods instead of just sound recordings.       Team Orange The goal of this tool is to help teens change how they think, act, and react. This app helps you focus on your own good feelings and experiences.      The [place] Box The [place] Box (VHB) contains simple tools to help patients with coping, relaxation, distraction, and positive thinking.

## 2020-08-28 NOTE — BH Specialist Note (Signed)
Integrated Behavioral Health via Telemedicine Visit  08/28/2020 Jennifer Norman [id number]  Number of Integrated Behavioral Health visits: 1 Session Start time: 4:30  Session End time: 5:03 Total time: 86  Referring Provider: Nolene Bernheim, NP Patient/Family location: Home [facility] Provider location: Center for [facility]  All persons participating in visit: Patient Jennifer Norman and [facility] Jennifer Norman   Types of Service: Individual psychotherapy  I connected with Forbes Cellar Telephone and verified that I am speaking with the correct person using two identifiers.    Discussed confidentiality: Yes   I discussed the limitations of telemedicine and the availability of in person appointments.  Discussed there is a possibility of technology failure and discussed alternative modes of communication if that failure occurs.  I discussed that engaging in this telemedicine visit, they consent to the provision of behavioral healthcare and the services will be billed under their insurance.  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 Pt requests to find out about community resources that may help alleviate additional stress. Duration of problem: Current pregnancy; Severity of problem: moderate  Patient and/or Family's Strengths/Protective Factors: Feels supported by friends and family  Goals Addressed: Patient will: 1.  Reduce symptoms of: stress  2.  Increase knowledge and/or ability of: healthy habits and stress reduction  3.  Demonstrate ability to: Increase healthy adjustment to current  life circumstances and Increase adequate support systems for patient/family  Progress towards Goals: Ongoing  Interventions: Interventions utilized:  Solution-Focused Strategies, Psychoeducation and/or Health Education and Link to [employer] Standardized Assessments completed: GAD-7 and PHQ 9  Patient and/or Family Response: Pt agrees with treatment plan  Assessment: Patient currently experiencing Psychosocial stress.   Patient may benefit from psychoeducation and brief therapeutic interventions regarding coping with current life stress .  Plan: 1. Follow up with behavioral health clinician on : Three weeks 2. Behavioral recommendations:  -Take prenatal vitamin daily (Set phone timer as reminder if needed) -Accept WIC referral -Go to [email or web address] to view Virtual Tour of [facility]; consider registering for childbirth education class, if needed -Consider additional resources on After Visit Summary (Postpartum Planner, [place] [facility], apps) to help with future planning -Attend Architect at next in-person visit to [employer]  3. Referral(s): Integrated Art gallery manager (In Clinic) and [employer]:  Food and childcare  I discussed the assessment and treatment plan with the patient and/or parent/guardian. They were provided an opportunity to ask questions and all were answered. They agreed with the plan and demonstrated an understanding of the instructions.   They were advised to call back or seek an in-person evaluation if the symptoms worsen or if the condition fails to improve as anticipated.  Rae Lips, LCSW   Depression screen [facility] 2/9 07/31/2020 07/05/2020  Decreased Interest 1 1  Down, Depressed, Hopeless 1 1  PHQ - 2 Score 2 2  Altered sleeping 1 1  Tired, decreased energy 1 1  Change in appetite 0 1  Feeling bad or failure about yourself  0 0  Trouble concentrating 0 0  Moving slowly or  fidgety/restless 0 0  Suicidal thoughts 0 0  PHQ-9 Score 4 5  GAD 7 : Generalized Anxiety Score 07/31/2020 07/05/2020  Nervous, Anxious, on Edge 1 1  Control/stop worrying 0 0  Worry too much - different things 1 1  Trouble relaxing 0 0  Restless 0 0  Easily annoyed or irritable 1 1  Afraid - awful might happen 0 0  Total GAD 7 Score 3 3  .

## 2020-08-28 NOTE — Progress Notes (Signed)
Pt states will take BP later & send thru My Chart.

## 2020-09-06 NOTE — BH Specialist Note (Deleted)
Integrated Behavioral Health via Telemedicine Visit  09/06/2020 Jennifer Norman [id number]  Number of Integrated Behavioral Health visits: *** Session Start time: ***  Session End time: *** Total time: {IBH Total Time:21014050}  Referring Provider: *** Patient/Family location: *** [facility] Provider location: *** All persons participating in visit: *** Types of Service: {CHL AMB TYPE OF SERVICE:[phone]}  I connected with Jennifer Norman and/or Jennifer Norman's {family members:20773} by Telephone  (Video is Caregility application) and verified that I am speaking with the correct person using two identifiers.Discussed confidentiality: {YES/NO:21197}  I discussed the limitations of telemedicine and the availability of in person appointments.  Discussed there is a possibility of technology failure and discussed alternative modes of communication if that failure occurs.  I discussed that engaging in this telemedicine visit, they consent to the provision of behavioral healthcare and the services will be billed under their insurance.  Patient and/or legal guardian expressed understanding and consented to Telemedicine visit: {YES/NO:21197}  Presenting Concerns: Patient and/or family reports the following symptoms/concerns: *** Duration of problem: ***; Severity of problem: {Mild/Moderate/Severe:20260}  Patient and/or Family's Strengths/Protective Factors: {CHL AMB BH PROTECTIVE [phone]}  Goals Addressed: Patient will: 1.  Reduce symptoms of: {IBH Symptoms:21014056}  2.  Increase knowledge and/or ability of: {IBH Patient Tools:21014057}  3.  Demonstrate ability to: {IBH Goals:21014053}  Progress towards Goals: {CHL AMB BH PROGRESS TOWARDS GOALS:[phone]}  Interventions: Interventions utilized:  {IBH Interventions:21014054} Standardized Assessments completed: {IBH Screening Tools:21014051}  Patient and/or Family Response: ***  Assessment: Patient currently experiencing  ***.   Patient may benefit from ***.  Plan: 1. Follow up with behavioral health clinician on : *** 2. Behavioral recommendations: *** 3. Referral(s): {IBH Referrals:21014055}  I discussed the assessment and treatment plan with the patient and/or parent/guardian. They were provided an opportunity to ask questions and all were answered. They agreed with the plan and demonstrated an understanding of the instructions.   They were advised to call back or seek an in-person evaluation if the symptoms worsen or if the condition fails to improve as anticipated.  Valetta Close Carrina Schoenberger, LCSW

## 2020-09-18 ENCOUNTER — Other Ambulatory Visit: Payer: Self-pay

## 2020-09-18 ENCOUNTER — Encounter: Payer: Self-pay | Admitting: Obstetrics and Gynecology

## 2020-09-18 ENCOUNTER — Ambulatory Visit (INDEPENDENT_AMBULATORY_CARE_PROVIDER_SITE_OTHER): Payer: 59 | Admitting: Obstetrics and Gynecology

## 2020-09-18 VITALS — BP 122/68 | HR 89 | Wt 218.0 lb

## 2020-09-18 DIAGNOSIS — Z3A24 24 weeks gestation of pregnancy: Secondary | ICD-10-CM

## 2020-09-18 DIAGNOSIS — O0990 Supervision of high risk pregnancy, unspecified, unspecified trimester: Secondary | ICD-10-CM

## 2020-09-18 DIAGNOSIS — F5089 Other specified eating disorder: Secondary | ICD-10-CM

## 2020-09-18 NOTE — Progress Notes (Signed)
   LOW-RISK PREGNANCY OFFICE VISIT Patient name: Jennifer Norman MRN [id number]  Date of birth: 08/19/1996 Chief Complaint:   Routine Prenatal Visit  History of Present Illness:   Jennifer Norman is a 24 y.o. G14P0010 female at [redacted]w[redacted]d with an Estimated Date of Delivery: 01/03/21 being seen today for ongoing management of a low-risk pregnancy.  Today she reports no complaints and has a strong desire to eat dirt, sand and rocks (she has not eaten any of these items). She does eat "lots of gas station crushed ice". Contractions: Not present. Vag. Bleeding: None.  Movement: Present. denies leaking of fluid. Review of Systems:   Pertinent items are noted in HPI Denies abnormal vaginal discharge w/ itching/odor/irritation, headaches, visual changes, shortness of breath, chest pain, abdominal pain, severe nausea/vomiting, or problems with urination or bowel movements unless otherwise stated above. Pertinent History Reviewed:  Reviewed past medical,surgical, social, obstetrical and family history.  Reviewed problem list, medications and allergies. Physical Assessment:   Vitals:   09/18/20 1103  BP: 122/68  Pulse: 89  Weight: 218 lb (98.9 kg)  Body mass index is 32.19 kg/m.        Physical Examination:   General appearance: Well appearing, and in no distress  Mental status: Alert, oriented to person, place, and time  Skin: Warm & dry  Cardiovascular: Normal heart rate noted  Respiratory: Normal respiratory effort, no distress  Abdomen: Soft, gravid, nontender  Pelvic: Cervical exam deferred         Extremities: Edema: None  Fetal Status: Fetal Heart Rate (bpm): 148   Movement: Present    No results found for this or any previous visit (from the past 24 hour(s)).  Assessment & Plan:  1) Low-risk pregnancy G2P0010 at [redacted]w[redacted]d with an Estimated Date of Delivery: 01/03/21   2) Supervision of high risk pregnancy, antepartum - Reviewed labs and U/S per pt request  - Reassurance given of normal labs  and U/S - Anticipatory guidance for 2 hr GTT - advised to fast after midnight without anything to eat or drink (except for water), will have fasting blood drawn, drink the glucola drink (flavor choices: orange, fruit punch or lemon-lime), have a visit with a provider during the first hour of testing, wait in the lab waiting room to have blood drawn at 1 hour and then 2 hours after finishing glucola drink.  3) [redacted] weeks gestation of pregnancy  4) Pica - Advised she could have iron deficency anemia - CBC will be checked at 2 hr GTT appt - Advised to NOT eat any of the items she is craving with exception of ice.   Meds: No orders of the defined types were placed in this encounter.  Labs/procedures today:   Plan:  Continue routine obstetrical care   Reviewed: Preterm labor symptoms and general obstetric precautions including but not limited to vaginal bleeding, contractions, leaking of fluid and fetal movement were reviewed in detail with the patient.  All questions were answered. Has home bp cuff. Check bp weekly, let us know if >140/90.   Follow-up: Return in about 4 weeks (around 10/16/2020) for Return OB 2hr GTT.  No orders of the defined types were placed in this encounter.  Raelyn Mora MSN, CNM 09/18/2020 11:19 AM

## 2020-09-19 NOTE — BH Specialist Note (Signed)
Integrated Behavioral Health via Telemedicine Visit  09/19/2020 Tovah Slavick [id number]  Pt did not arrive to video visit and did not answer the phone ; Left HIPPA-compliant message to call back Asher Muir from Center for [employer] at [facility] at [phone] (main office) or [phone] (Omya Winfield's office).  ; left MyChart message for patient.     Valetta Close Rainey Kahrs, LCSW

## 2020-09-28 ENCOUNTER — Ambulatory Visit: Payer: 59 | Admitting: Clinical

## 2020-09-28 DIAGNOSIS — Z5329 Procedure and treatment not carried out because of patient's decision for other reasons: Secondary | ICD-10-CM

## 2020-09-28 DIAGNOSIS — Z91199 Patient's noncompliance with other medical treatment and regimen due to unspecified reason: Secondary | ICD-10-CM

## 2020-10-16 ENCOUNTER — Other Ambulatory Visit: Payer: Self-pay

## 2020-10-16 DIAGNOSIS — O0990 Supervision of high risk pregnancy, unspecified, unspecified trimester: Secondary | ICD-10-CM

## 2020-10-17 ENCOUNTER — Other Ambulatory Visit: Payer: 59

## 2020-10-17 ENCOUNTER — Ambulatory Visit (INDEPENDENT_AMBULATORY_CARE_PROVIDER_SITE_OTHER): Payer: 59 | Admitting: Family Medicine

## 2020-10-17 ENCOUNTER — Other Ambulatory Visit: Payer: Self-pay

## 2020-10-17 ENCOUNTER — Encounter: Payer: Self-pay | Admitting: Family Medicine

## 2020-10-17 VITALS — BP 118/74 | HR 82 | Wt 224.5 lb

## 2020-10-17 DIAGNOSIS — O219 Vomiting of pregnancy, unspecified: Secondary | ICD-10-CM

## 2020-10-17 DIAGNOSIS — R7989 Other specified abnormal findings of blood chemistry: Secondary | ICD-10-CM

## 2020-10-17 DIAGNOSIS — O0990 Supervision of high risk pregnancy, unspecified, unspecified trimester: Secondary | ICD-10-CM

## 2020-10-17 MED ORDER — OMEPRAZOLE 20 MG PO CPDR: 20.0000 mg | DELAYED_RELEASE_CAPSULE | Freq: Every day | ORAL | 5 refills | Status: AC

## 2020-10-17 MED ORDER — ONDANSETRON 4 MG PO TBDP: 4.0000 mg | ORAL_TABLET | Freq: Three times a day (TID) | ORAL | 0 refills | Status: AC | PRN

## 2020-10-17 NOTE — Progress Notes (Signed)
   Subjective:  Jennifer Norman is a 25 y.o. G2P0010 at [redacted]w[redacted]d being seen today for ongoing prenatal care.  She is currently monitored for the following issues for this high-risk pregnancy and has Supervision of high risk pregnancy, antepartum; BMI 30.0-30.9,adult; History of Hirschsprung's disease; and Elevated TSH on their problem list.  Patient reports heartburn and nausea.  Contractions: Not present. Vag. Bleeding: None.  Movement: Present. Denies leaking of fluid.   The following portions of the patient's history were reviewed and updated as appropriate: allergies, current medications, past family history, past medical history, past social history, past surgical history and problem list. Problem list updated.  Objective:   Vitals:   10/17/20 0910  BP: 118/74  Pulse: 82  Weight: 224 lb 8 oz (101.8 kg)    Fetal Status: Fetal Heart Rate (bpm): 136   Movement: Present     General:  Alert, oriented and cooperative. Patient is in no acute distress.  Skin: Skin is warm and dry. No rash noted.   Cardiovascular: Normal heart rate noted  Respiratory: Normal respiratory effort, no problems with respiration noted  Abdomen: Soft, gravid, appropriate for gestational age. Pain/Pressure: Present     Pelvic: Vag. Bleeding: None     Cervical exam deferred        Extremities: Normal range of motion.  Edema: None  Mental Status: Normal mood and affect. Normal behavior. Normal judgment and thought content.   Urinalysis:      Assessment and Plan:  Pregnancy: G2P0010 at [redacted]w[redacted]d  1. Supervision of high risk pregnancy, antepartum BP and FHR normal Not fasting, will get 28wk labs later this week TDaP discussed, she will consider Discussed contraception, unsure, referred to bedsider rx zofran and omeprazole for gerd/nausea of pregnancy  2. Elevated TSH Prior subclinical hypothyroidism on labs, recheck now that she is in 3rd trimester. Will do on Friday when she returns - TSH - T4, free  Preterm  labor symptoms and general obstetric precautions including but not limited to vaginal bleeding, contractions, leaking of fluid and fetal movement were reviewed in detail with the patient. Please refer to After Visit Summary for other counseling recommendations.  Return in 2 weeks (on 10/31/2020) for ob visit.   Venora Maples, MD

## 2020-10-17 NOTE — Patient Instructions (Signed)
 Contraception Choices Contraception, also called birth control, refers to methods or devices that prevent pregnancy. Hormonal methods Contraceptive implant A contraceptive implant is a thin, plastic tube that contains a hormone that prevents pregnancy. It is different from an intrauterine device (IUD). It is inserted into the upper part of the arm by a health care provider. Implants can be effective for up to 3 years. Progestin-only injections Progestin-only injections are injections of progestin, a synthetic form of the hormone progesterone. They are given every 3 months by a health care provider. Birth control pills Birth control pills are pills that contain hormones that prevent pregnancy. They must be taken once a day, preferably at the same time each day. A prescription is needed to use this method of contraception. Birth control patch The birth control patch contains hormones that prevent pregnancy. It is placed on the skin and must be changed once a week for three weeks and removed on the fourth week. A prescription is needed to use this method of contraception. Vaginal ring A vaginal ring contains hormones that prevent pregnancy. It is placed in the vagina for three weeks and removed on the fourth week. After that, the process is repeated with a new ring. A prescription is needed to use this method of contraception. Emergency contraceptive Emergency contraceptives prevent pregnancy after unprotected sex. They come in pill form and can be taken up to 5 days after sex. They work best the sooner they are taken after having sex. Most emergency contraceptives are available without a prescription. This method should not be used as your only form of birth control.   Barrier methods Female condom A female condom is a thin sheath that is worn over the penis during sex. Condoms keep sperm from going inside a woman's body. They can be used with a sperm-killing substance (spermicide) to increase their  effectiveness. They should be thrown away after one use. Female condom A female condom is a soft, loose-fitting sheath that is put into the vagina before sex. The condom keeps sperm from going inside a woman's body. They should be thrown away after one use. Diaphragm A diaphragm is a soft, dome-shaped barrier. It is inserted into the vagina before sex, along with a spermicide. The diaphragm blocks sperm from entering the uterus, and the spermicide kills sperm. A diaphragm should be left in the vagina for 6-8 hours after sex and removed within 24 hours. A diaphragm is prescribed and fitted by a health care provider. A diaphragm should be replaced every 1-2 years, after giving birth, after gaining more than 15 lb (6.8 kg), and after pelvic surgery. Cervical cap A cervical cap is a round, soft latex or plastic cup that fits over the cervix. It is inserted into the vagina before sex, along with spermicide. It blocks sperm from entering the uterus. The cap should be left in place for 6-8 hours after sex and removed within 48 hours. A cervical cap must be prescribed and fitted by a health care provider. It should be replaced every 2 years. Sponge A sponge is a soft, circular piece of polyurethane foam with spermicide in it. The sponge helps block sperm from entering the uterus, and the spermicide kills sperm. To use it, you make it wet and then insert it into the vagina. It should be inserted before sex, left in for at least 6 hours after sex, and removed and thrown away within 30 hours. Spermicides Spermicides are chemicals that kill or block sperm from entering the   cervix and uterus. They can come as a cream, jelly, suppository, foam, or tablet. A spermicide should be inserted into the vagina with an applicator at least 10-15 minutes before sex to allow time for it to work. The process must be repeated every time you have sex. Spermicides do not require a prescription.   Intrauterine  contraception Intrauterine device (IUD) An IUD is a T-shaped device that is put in a woman's uterus. There are two types:  Hormone IUD.This type contains progestin, a synthetic form of the hormone progesterone. This type can stay in place for 3-5 years.  Copper IUD.This type is wrapped in copper wire. It can stay in place for 10 years. Permanent methods of contraception Female tubal ligation In this method, a woman's fallopian tubes are sealed, tied, or blocked during surgery to prevent eggs from traveling to the uterus. Hysteroscopic sterilization In this method, a small, flexible insert is placed into each fallopian tube. The inserts cause scar tissue to form in the fallopian tubes and block them, so sperm cannot reach an egg. The procedure takes about 3 months to be effective. Another form of birth control must be used during those 3 months. Female sterilization This is a procedure to tie off the tubes that carry sperm (vasectomy). After the procedure, the man can still ejaculate fluid (semen). Another form of birth control must be used for 3 months after the procedure. Natural planning methods Natural family planning In this method, a couple does not have sex on days when the woman could become pregnant. Calendar method In this method, the woman keeps track of the length of each menstrual cycle, identifies the days when pregnancy can happen, and does not have sex on those days. Ovulation method In this method, a couple avoids sex during ovulation. Symptothermal method This method involves not having sex during ovulation. The woman typically checks for ovulation by watching changes in her temperature and in the consistency of cervical mucus. Post-ovulation method In this method, a couple waits to have sex until after ovulation. Where to find more information  Centers for Disease Control and Prevention: www.cdc.gov Summary  Contraception, also called birth control, refers to methods or  devices that prevent pregnancy.  Hormonal methods of contraception include implants, injections, pills, patches, vaginal rings, and emergency contraceptives.  Barrier methods of contraception can include female condoms, female condoms, diaphragms, cervical caps, sponges, and spermicides.  There are two types of IUDs (intrauterine devices). An IUD can be put in a woman's uterus to prevent pregnancy for 3-5 years.  Permanent sterilization can be done through a procedure for males and females. Natural family planning methods involve nothaving sex on days when the woman could become pregnant. This information is not intended to replace advice given to you by your health care provider. Make sure you discuss any questions you have with your health care provider. Document Revised: 02/13/2020 Document Reviewed: 02/13/2020 Elsevier Patient Education  2021 [employer]   Breastfeeding  Choosing to breastfeed is one of the best decisions you can make for yourself and your baby. A change in hormones during pregnancy causes your breasts to make breast milk in your milk-producing glands. Hormones prevent breast milk from being released before your baby is born. They also prompt milk flow after birth. Once breastfeeding has begun, thoughts of your baby, as well as his or her sucking or crying, can stimulate the release of milk from your milk-producing glands. Benefits of breastfeeding Research shows that breastfeeding offers many health benefits   for infants and mothers. It also offers a cost-free and convenient way to feed your baby. For your baby  Your first milk (colostrum) helps your baby's digestive system to function better.  Special cells in your milk (antibodies) help your baby to fight off infections.  Breastfed babies are less likely to develop asthma, allergies, obesity, or type 2 diabetes. They are also at lower risk for sudden infant death syndrome (SIDS).  Nutrients in breast milk are better  able to meet your baby's needs compared to infant formula.  Breast milk improves your baby's brain development. For you  Breastfeeding helps to create a very special bond between you and your baby.  Breastfeeding is convenient. Breast milk costs nothing and is always available at the correct temperature.  Breastfeeding helps to burn calories. It helps you to lose the weight that you gained during pregnancy.  Breastfeeding makes your uterus return faster to its size before pregnancy. It also slows bleeding (lochia) after you give birth.  Breastfeeding helps to lower your risk of developing type 2 diabetes, osteoporosis, rheumatoid arthritis, cardiovascular disease, and breast, ovarian, uterine, and endometrial cancer later in life. Breastfeeding basics Starting breastfeeding  Find a comfortable place to sit or lie down, with your neck and back well-supported.  Place a pillow or a rolled-up blanket under your baby to bring him or her to the level of your breast (if you are seated). Nursing pillows are specially designed to help support your arms and your baby while you breastfeed.  Make sure that your baby's tummy (abdomen) is facing your abdomen.  Gently massage your breast. With your fingertips, massage from the outer edges of your breast inward toward the nipple. This encourages milk flow. If your milk flows slowly, you may need to continue this action during the feeding.  Support your breast with 4 fingers underneath and your thumb above your nipple (make the letter "C" with your hand). Make sure your fingers are well away from your nipple and your baby's mouth.  Stroke your baby's lips gently with your finger or nipple.  When your baby's mouth is open wide enough, quickly bring your baby to your breast, placing your entire nipple and as much of the areola as possible into your baby's mouth. The areola is the colored area around your nipple. ? More areola should be visible above your  baby's upper lip than below the lower lip. ? Your baby's lips should be opened and extended outward (flanged) to ensure an adequate, comfortable latch. ? Your baby's tongue should be between his or her lower gum and your breast.  Make sure that your baby's mouth is correctly positioned around your nipple (latched). Your baby's lips should create a seal on your breast and be turned out (everted).  It is common for your baby to suck about 2-3 minutes in order to start the flow of breast milk. Latching Teaching your baby how to latch onto your breast properly is very important. An improper latch can cause nipple pain, decreased milk supply, and poor weight gain in your baby. Also, if your baby is not latched onto your nipple properly, he or she may swallow some air during feeding. This can make your baby fussy. Burping your baby when you switch breasts during the feeding can help to get rid of the air. However, teaching your baby to latch on properly is still the best way to prevent fussiness from swallowing air while breastfeeding. Signs that your baby has successfully latched onto   your nipple  Silent tugging or silent sucking, without causing you pain. Infant's lips should be extended outward (flanged).  Swallowing heard between every 3-4 sucks once your milk has started to flow (after your let-down milk reflex occurs).  Muscle movement above and in front of his or her ears while sucking. Signs that your baby has not successfully latched onto your nipple  Sucking sounds or smacking sounds from your baby while breastfeeding.  Nipple pain. If you think your baby has not latched on correctly, slip your finger into the corner of your baby's mouth to break the suction and place it between your baby's gums. Attempt to start breastfeeding again. Signs of successful breastfeeding Signs from your baby  Your baby will gradually decrease the number of sucks or will completely stop sucking.  Your baby  will fall asleep.  Your baby's body will relax.  Your baby will retain a small amount of milk in his or her mouth.  Your baby will let go of your breast by himself or herself. Signs from you  Breasts that have increased in firmness, weight, and size 1-3 hours after feeding.  Breasts that are softer immediately after breastfeeding.  Increased milk volume, as well as a change in milk consistency and color by the fifth day of breastfeeding.  Nipples that are not sore, cracked, or bleeding. Signs that your baby is getting enough milk  Wetting at least 1-2 diapers during the first 24 hours after birth.  Wetting at least 5-6 diapers every 24 hours for the first week after birth. The urine should be clear or pale yellow by the age of 5 days.  Wetting 6-8 diapers every 24 hours as your baby continues to grow and develop.  At least 3 stools in a 24-hour period by the age of 5 days. The stool should be soft and yellow.  At least 3 stools in a 24-hour period by the age of 7 days. The stool should be seedy and yellow.  No loss of weight greater than 10% of birth weight during the first 3 days of life.  Average weight gain of 4-7 oz (113-198 g) per week after the age of 4 days.  Consistent daily weight gain by the age of 5 days, without weight loss after the age of 2 weeks. After a feeding, your baby may spit up a small amount of milk. This is normal. Breastfeeding frequency and duration Frequent feeding will help you make more milk and can prevent sore nipples and extremely full breasts (breast engorgement). Breastfeed when you feel the need to reduce the fullness of your breasts or when your baby shows signs of hunger. This is called "breastfeeding on demand." Signs that your baby is hungry include:  Increased alertness, activity, or restlessness.  Movement of the head from side to side.  Opening of the mouth when the corner of the mouth or cheek is stroked (rooting).  Increased  sucking sounds, smacking lips, cooing, sighing, or squeaking.  Hand-to-mouth movements and sucking on fingers or hands.  Fussing or crying. Avoid introducing a pacifier to your baby in the first 4-6 weeks after your baby is born. After this time, you may choose to use a pacifier. Research has shown that pacifier use during the first year of a baby's life decreases the risk of sudden infant death syndrome (SIDS). Allow your baby to feed on each breast as long as he or she wants. When your baby unlatches or falls asleep while feeding from the   first breast, offer the second breast. Because newborns are often sleepy in the first few weeks of life, you may need to awaken your baby to get him or her to feed. Breastfeeding times will vary from baby to baby. However, the following rules can serve as a guide to help you make sure that your baby is properly fed:  Newborns (babies 4 weeks of age or younger) may breastfeed every 1-3 hours.  Newborns should not go without breastfeeding for longer than 3 hours during the day or 5 hours during the night.  You should breastfeed your baby a minimum of 8 times in a 24-hour period. Breast milk pumping Pumping and storing breast milk allows you to make sure that your baby is exclusively fed your breast milk, even at times when you are unable to breastfeed. This is especially important if you go back to work while you are still breastfeeding, or if you are not able to be present during feedings. Your lactation consultant can help you find a method of pumping that works best for you and give you guidelines about how long it is safe to store breast milk.      Caring for your breasts while you breastfeed Nipples can become dry, cracked, and sore while breastfeeding. The following recommendations can help keep your breasts moisturized and healthy:  Avoid using soap on your nipples.  Wear a supportive bra designed especially for nursing. Avoid wearing underwire-style  bras or extremely tight bras (sports bras).  Air-dry your nipples for 3-4 minutes after each feeding.  Use only cotton bra pads to absorb leaked breast milk. Leaking of breast milk between feedings is normal.  Use lanolin on your nipples after breastfeeding. Lanolin helps to maintain your skin's normal moisture barrier. Pure lanolin is not harmful (not toxic) to your baby. You may also hand express a few drops of breast milk and gently massage that milk into your nipples and allow the milk to air-dry. In the first few weeks after giving birth, some women experience breast engorgement. Engorgement can make your breasts feel heavy, warm, and tender to the touch. Engorgement peaks within 3-5 days after you give birth. The following recommendations can help to ease engorgement:  Completely empty your breasts while breastfeeding or pumping. You may want to start by applying warm, moist heat (in the shower or with warm, water-soaked hand towels) just before feeding or pumping. This increases circulation and helps the milk flow. If your baby does not completely empty your breasts while breastfeeding, pump any extra milk after he or she is finished.  Apply ice packs to your breasts immediately after breastfeeding or pumping, unless this is too uncomfortable for you. To do this: ? Put ice in a plastic bag. ? Place a towel between your skin and the bag. ? Leave the ice on for 20 minutes, 2-3 times a day.  Make sure that your baby is latched on and positioned properly while breastfeeding. If engorgement persists after 48 hours of following these recommendations, contact your health care provider or a lactation consultant. Overall health care recommendations while breastfeeding  Eat 3 healthy meals and 3 snacks every day. Well-nourished mothers who are breastfeeding need an additional 450-500 calories a day. You can meet this requirement by increasing the amount of a balanced diet that you eat.  Drink  enough water to keep your urine pale yellow or clear.  Rest often, relax, and continue to take your prenatal vitamins to prevent fatigue, stress, and low   vitamin and mineral levels in your body (nutrient deficiencies).  Do not use any products that contain nicotine or tobacco, such as cigarettes and e-cigarettes. Your baby may be harmed by chemicals from cigarettes that pass into breast milk and exposure to secondhand smoke. If you need help quitting, ask your health care provider.  Avoid alcohol.  Do not use illegal drugs or marijuana.  Talk with your health care provider before taking any medicines. These include over-the-counter and prescription medicines as well as vitamins and herbal supplements. Some medicines that may be harmful to your baby can pass through breast milk.  It is possible to become pregnant while breastfeeding. If birth control is desired, ask your health care provider about options that will be safe while breastfeeding your baby. Where to find more information: [employer]: www.llli.org Contact a health care provider if:  You feel like you want to stop breastfeeding or have become frustrated with breastfeeding.  Your nipples are cracked or bleeding.  Your breasts are red, tender, or warm.  You have: ? Painful breasts or nipples. ? A swollen area on either breast. ? A fever or chills. ? Nausea or vomiting. ? Drainage other than breast milk from your nipples.  Your breasts do not become full before feedings by the fifth day after you give birth.  You feel sad and depressed.  Your baby is: ? Too sleepy to eat well. ? Having trouble sleeping. ? More than 1 week old and wetting fewer than 6 diapers in a 24-hour period. ? Not gaining weight by 5 days of age.  Your baby has fewer than 3 stools in a 24-hour period.  Your baby's skin or the white parts of his or her eyes become yellow. Get help right away if:  Your baby is overly tired  (lethargic) and does not want to wake up and feed.  Your baby develops an unexplained fever. Summary  Breastfeeding offers many health benefits for infant and mothers.  Try to breastfeed your infant when he or she shows early signs of hunger.  Gently tickle or stroke your baby's lips with your finger or nipple to allow the baby to open his or her mouth. Bring the baby to your breast. Make sure that much of the areola is in your baby's mouth. Offer one side and burp the baby before you offer the other side.  Talk with your health care provider or lactation consultant if you have questions or you face problems as you breastfeed. This information is not intended to replace advice given to you by your health care provider. Make sure you discuss any questions you have with your health care provider. Document Revised: 12/03/2017 Document Reviewed: 10/10/2016 Else[employer] Patient Education  2021 [employer]  

## 2020-10-18 NOTE — BH Specialist Note (Signed)
Integrated Behavioral Health via Telemedicine Visit  10/18/2020 Jennifer Norman [id number]  Number of Integrated Behavioral Health visits: 2 Session Start time: 2:56  Session End time: 3:25 Total time: 29  Referring Provider: Thressa Sheller, CNM Patient/Family location: Home [facility] Provider location: [facility]  All persons participating in visit: Patient Jennifer Norman and [facility] Jamie McMannes   Types of Service: Telephone visit  I connected with Jennifer Norman and/or Jennifer Norman's n/a by Telephone  (Video is Caregility application) and verified that I am speaking with the correct person using two identifiers.Discussed confidentiality: Yes   I discussed the limitations of telemedicine and the availability of in person appointments.  Discussed there is a possibility of technology failure and discussed alternative modes of communication if that failure occurs.  I discussed that engaging in this telemedicine visit, they consent to the provision of behavioral healthcare and the services will be billed under their insurance.  Patient and/or legal guardian expressed understanding and consented to Telemedicine visit: Yes   Presenting Concerns: Patient and/or family reports the following symptoms/concerns: Pt states her primary concern today is feeling nervous, anxious, and stress with relationship issue; pt will be moving back home to [place] with family this weekend until after baby is born, and will return to finish school.  Duration of problem: Current pregnancy; Severity of problem: moderate  Patient and/or Family's Strengths/Protective Factors: Social connections and Sense of purpose  Goals Addressed: Patient will: 1.  Reduce symptoms of: stress  2.  Increase knowledge and/or ability of: stress reduction  3.  Demonstrate ability to: Increase healthy adjustment to current life circumstances and Increase adequate support systems for  patient/family  Progress towards Goals: Ongoing  Interventions: Interventions utilized:  Solution-Focused Strategies Standardized Assessments completed: Not Needed  Patient and/or Family Response: Pt agrees to treatment plan  Assessment: Patient currently experiencing Psychosocial stress.   Patient may benefit from continued psychoeducation and brief therapeutic interventions regarding coping with symptoms of stress .  Plan: 1. Follow up with behavioral health clinician on : Three months; Call Asher Muir at [phone] as needed 2. Behavioral recommendations:  -Continue taking prenatal vitamin as prescribed -Consider community resources (on After Visit Summary) upon return to [place] postpartum 3. Referral(s): Integrated [employer] (In Clinic)  I discussed the assessment and treatment plan with the patient and/or parent/guardian. They were provided an opportunity to ask questions and all were answered. They agreed with the plan and demonstrated an understanding of the instructions.   They were advised to call back or seek an in-person evaluation if the symptoms worsen or if the condition fails to improve as anticipated.  Rae Lips, LCSW   Depression screen [facility] 2/9 09/18/2020 07/31/2020 07/05/2020  Decreased Interest 0 1 1  Down, Depressed, Hopeless 0 1 1  PHQ - 2 Score 0 2 2  Altered sleeping 2 1 1   Tired, decreased energy 1 1 1   Change in appetite 1 0 1  Feeling bad or failure about yourself  0 0 0  Trouble concentrating 0 0 0  Moving slowly or fidgety/restless 0 0 0  Suicidal thoughts 0 0 0  PHQ-9 Score 4 4 5    GAD 7 : Generalized Anxiety Score 09/18/2020 07/31/2020 07/05/2020  Nervous, Anxious, on Edge 1 1 1   Control/stop worrying 0 0 0  Worry too much - different things 1 1 1   Trouble relaxing 0 0 0  Restless 1 0 0  Easily annoyed or irritable 0  1 1  Afraid - awful might happen 0 0 0  Total GAD 7 Score 3 3 3

## 2020-10-19 ENCOUNTER — Other Ambulatory Visit: Payer: Self-pay

## 2020-10-19 ENCOUNTER — Other Ambulatory Visit: Payer: 59

## 2020-10-19 DIAGNOSIS — R7989 Other specified abnormal findings of blood chemistry: Secondary | ICD-10-CM

## 2020-10-19 DIAGNOSIS — O0990 Supervision of high risk pregnancy, unspecified, unspecified trimester: Secondary | ICD-10-CM

## 2020-10-20 LAB — CBC
Hematocrit: 32.1 % — ABNORMAL LOW (ref 34.0–46.6)
Hemoglobin: 10.8 g/dL — ABNORMAL LOW (ref 11.1–15.9)
MCH: 29.2 pg (ref 26.6–33.0)
MCHC: 33.6 g/dL (ref 31.5–35.7)
MCV: 87 fL (ref 79–97)
Platelets: 130 10*3/uL — ABNORMAL LOW (ref 150–450)
RBC: 3.7 x10E6/uL — ABNORMAL LOW (ref 3.77–5.28)
RDW: 11.8 % (ref 11.7–15.4)
WBC: 4.5 10*3/uL (ref 3.4–10.8)

## 2020-10-20 LAB — T4, FREE: Free T4: 1.1 ng/dL (ref 0.82–1.77)

## 2020-10-20 LAB — HIV ANTIBODY (ROUTINE TESTING W REFLEX): HIV Screen 4th Generation wRfx: NONREACTIVE

## 2020-10-20 LAB — TSH: TSH: 4.92 u[IU]/mL — ABNORMAL HIGH (ref 0.450–4.500)

## 2020-10-20 LAB — RPR: RPR Ser Ql: NONREACTIVE

## 2020-10-20 LAB — GLUCOSE TOLERANCE, 2 HOURS W/ 1HR
Glucose, 1 hour: 86 mg/dL (ref 65–179)
Glucose, 2 hour: 67 mg/dL (ref 65–152)
Glucose, Fasting: 82 mg/dL (ref 65–91)

## 2020-10-22 ENCOUNTER — Other Ambulatory Visit: Payer: Self-pay | Admitting: Family Medicine

## 2020-10-22 DIAGNOSIS — O99013 Anemia complicating pregnancy, third trimester: Secondary | ICD-10-CM | POA: Insufficient documentation

## 2020-10-22 MED ORDER — FERROUS SULFATE 325 (65 FE) MG PO TBEC: 325.0000 mg | DELAYED_RELEASE_TABLET | ORAL | 2 refills | Status: AC

## 2020-10-22 NOTE — Progress Notes (Signed)
Iron pills rx

## 2020-10-29 ENCOUNTER — Ambulatory Visit (INDEPENDENT_AMBULATORY_CARE_PROVIDER_SITE_OTHER): Payer: 59 | Admitting: Clinical

## 2020-10-29 DIAGNOSIS — F419 Anxiety disorder, unspecified: Secondary | ICD-10-CM

## 2020-10-29 DIAGNOSIS — Z658 Other specified problems related to psychosocial circumstances: Secondary | ICD-10-CM | POA: Diagnosis not present

## 2020-10-29 NOTE — Patient Instructions (Signed)
Center for [facility] [place], [place] [address] [phone] (main office) [phone] ([place] office)  New mom support groups online: www.postpartum.net  Armed forces training and education officer  (Childcare options, Early childcare development, etc.) PopTick.no  LIEAP ([place]) ReportZoo.[email or web address]  [employer] ([place]) [email or web address]   [facility]om[facility]cent[facility].[place] primary care offices accepting new patients:   Primary Care at [facility] [address] [place], [place] [address] [phone]  [facility] [place], [place] [address] [facility] [address] [place], [place] [address] [place] [address] [place], [place] [address] [phone]  Patient Oakley Tumacacori-Carmen. [place],  [place]  [address] [phone]       BRAINSTORMING  Develop a Plan Goals: . Provide a way to start conversation about your new life with a baby . Assist parents in recognizing and using resources within their reach . Help pave the way before birth for an easier period of transition afterwards.  Make a list of the following information to keep in a central location: . Full name of Mom and Partner: _____________________________________________ . 69 full name and Date of Birth: ___________________________________________ . Home Address: ___________________________________________________________ ________________________________________________________________________ . Home Phone:  ____________________________________________________________ . Parents' cell numbers: _____________________________________________________ ________________________________________________________________________ . Name and contact info for OB: ______________________________________________ . Name and contact info for Pediatrician:________________________________________ . Contact info for Lactation Consultants: ________________________________________  REST and SLEEP *You each need at least 4-5 hours of uninterrupted sleep every day. Write specific names and contact information.* . How are you going to rest in the postpartum period? While partner's home? When partner returns to work? When you both return to work? Marland Kitchen Where will your baby sleep? Marland Kitchen Who is available to help during the day? Evening? Night? . Who could move in for a period to help support you? Marland Kitchen What are some ideas to help you get enough sleep? __________________________________________________________________________________________________________________________________________________________________________________________________________________________________________ NUTRITIOUS FOOD AND DRINK *Plan for meals before your baby is born so you can have healthy food to eat during the immediate postpartum period.* . Who will look after breakfast? Lunch? Dinner? List names and contact information. Brainstorm quick, healthy ideas for each meal. . What can you do before baby is born to prepare meals for the postpartum period? . How can others help you with meals? Marland Kitchen Which grocery stores provide online shopping and delivery? Marland Kitchen Which restaurants offer take-out or delivery  options? ______________________________________________________________________________________________________________________________________________________________________________________________________________________________________________________________________________________________________________________________________________________________________________________________________  CARE FOR MOM *It's important that mom is cared for and pampered in the postpartum period. Remember, the most important ways new mothers need care are: sleep, nutrition, gentle exercise, and time off.* . Who can come take care of mom during this period? Make a list of people with their contact information. . List some activities that make you feel cared for, rested, and energized? Who can make sure you have opportunities to do these things? . Does mom have a space of her very own within your home that's just for her? Make a "[facility]" where she can be comfortable, rest, and renew herself daily. ______________________________________________________________________________________________________________________________________________________________________________________________________________________________________________________________________________________________________________________________________________________________________________________________________    CARE FOR AND FEEDING BABY *Knowledgeable and encouraging people will offer the best support with regard to feeding your baby.* . Educate yourself and choose the best feeding option for your baby. . Make a list of people who will guide, support, and be  a resource for you as your care for and feed your baby. (Friends that have breastfed or are currently breastfeeding, lactation consultants, breastfeeding support groups, etc.) . Consider a postpartum doula. (These websites can give you information: dona.org & BuyingShow.es) . Seek out  local breastfeeding resources like the breastfeeding support group at [employer] or [employer]. ______________________________________________________________________________________________________________________________________________________________________________________________________________________________________________________________________________________________________________________________________________________________________________________________________  Verner Chol AND ERRANDS . Who can help with a thorough cleaning before baby is born? . Make a list of people who will help with housekeeping and chores, like laundry, light cleaning, dishes, bathrooms, etc. . Who can run some errands for you? Marland Kitchen What can you do to make sure you are stocked with basic supplies before baby is born? . Who is going to do the shopping? ______________________________________________________________________________________________________________________________________________________________________________________________________________________________________________________________________________________________________________________________________________________________________________________________________     Family Adjustment *Nurture yourselves.it helps parents be more loving and allows for better bonding with their child.* . What sorts of things do you and partner enjoy doing together? Which activities help you to connect and strengthen your relationship? Make a list of those things. Make a list of people whom you trust to care for your baby so you can have some time together as a couple. . What types of things help partner feel connected to Mom? Make a list. . What needs will partner have in order to bond with baby? . Other children? Who will care for them when you go into labor and while you are in the hospital? . Think about what the needs of your older children might be.  Who can help you meet those needs? In what ways are you helping them prepare for bringing baby home? List some specific strategies you have for family adjustment. _______________________________________________________________________________________________________________________________________________________________________________________________________________________________________________________________________________________________________________________________________________  SUPPORT *Someone who can empathize with experiences normalizes your problems and makes them more bearable.* . Make a list of other friends, neighbors, and/or co-workers you know with infants (and small children, if applicable) with whom you can connect. . Make a list of local or online support groups, mom groups, etc. in which you can be involved. ______________________________________________________________________________________________________________________________________________________________________________________________________________________________________________________________________________________________________________________________________________________________________________________________________  Childcare Plans . Investigate and plan for childcare if mom is returning to work. . Talk about mom's concerns about her transition back to work. . Talk about partner's concerns regarding this transition.  Mental Health *Your mental health is one of the highest priorities for a pregnant or postpartum mom.* . 1 in 5 women experience anxiety and/or depression from the time of conception through the first year after birth. . Postpartum Mood Disorders are the #1 complication of pregnancy and childbirth and the suffering experienced by these mothers is not necessary! These illnesses are temporary and respond well to treatment, which often includes self-care, social support, talk therapy,  and medication when needed. . Women experiencing anxiety and depression often say things like: "I'm supposed to be happy.why do I feel so sad?", "Why can't I snap out of it?", "I'm having thoughts that scare me." . There is no need to be embarrassed if you are feeling these symptoms: o Overwhelmed, anxious, angry, sad, guilty, irritable, hopeless, exhausted but can't sleep o You are NOT alone. You are NOT to blame. With help, you WILL be well. . Where can I find help? Medical professionals such as your OB, midwife, gynecologist, family practitioner, primary care provider, pediatrician, or mental health providers; [facility] support groups: Feelings After Birth, Breastfeeding Support Group, Baby and Me Group, and Fit 4 Two exercise classes. . You have permission to ask for help. It will confirm your feelings, validate your  experiences, share/learn coping strategies, and gain support and encouragement as you heal. You are important! BRAINSTORM . Make a list of local resources, including resources for mom and for partner. . Identify support groups. . Identify people to call late at night - include names and contact info. . Talk with partner about perinatal mood and anxiety disorders. . Talk with your OB, midwife, and doula about baby blues and about perinatal mood and anxiety disorders. . Talk with your pediatrician about perinatal mood and anxiety disorders.   Support & Sanity Savers   What do you really need?  . Basics . In preparing for a new baby, many expectant parents spend hours shopping for baby clothes, decorating the nursery, and deciding which car seat to buy. Yet most don't think much about what the reality of parenting a newborn will be like, and what they need to make it through that. So, here is the advice of experienced parents. We know you'll read this, and think "they're exaggerating, I don't really need that." Just trust [place] on these, OK? Plan for all of this, and if it turns  out you don't need it, come back and teach [place] how you did it!  Satira Anis (Once baby's survival needs are met, make sure you attend to your own survival needs!) . Sleep . An average newborn sleeps 16-18 hours per day, over 6-7 sleep periods, rarely more than three hours at a time. It is normal and healthy for a newborn to wake throughout the night... but really hard on parents!! . Naps. Prioritize sleep above any responsibilities like: cleaning house, visiting friends, running errands, etc.  Sleep whenever baby sleeps. If you can't nap, at least have restful times when baby eats. The more rest you get, the more patient you will be, the more emotionally stable, and better at solving problems.  . Food . You may not have realized it would be difficult to eat when you have a newborn. Yet, when we talk to . countless new parents, they say things like "it may be 2:00 pm when I realize I haven't had breakfast yet." Or "every time we sit down to dinner, baby needs to eat, and my food gets cold, so I don't bother to eat it." . Finger food. Before your baby is born, stock up with one months' worth of food that: 1) you can eat with one hand while holding a baby, 2) doesn't need to be prepped, 3) is good hot or cold, 4) doesn't spoil when left out for a few hours, and 5) you like to eat. Think about: nuts, dried fruit, Clif bars, pretzels, jerky, gogurt, baby carrots, apples, bananas, crackers, cheez-n-crackers, string cheese, hot pockets or frozen burritos to microwave, garden burgers and breakfast pastries to put in the toaster, yogurt drinks, etc. . Restaurant Menus. Make lists of your favorite restaurants & menu items. When family/friends want to help, you can give specific information without much thought. They can either bring you the food or send gift cards for just the right meals. Rosaura Carpenter Meals.  Take some time to make a few meals to put in the freezer ahead of time.  Easy to freeze meals can be  anything such as soup, lasagna, chicken pie, or spaghetti sauce. . Set up a Meal Schedule.  Ask friends and family to sign up to bring you meals during the first few weeks of being home. (It can be passed around at baby showers!) You have no idea how helpful  this will be until you are in the throes of parenting.  https://[email or web address] is a great website to check out. . Emotional Support . Know who to call when you're stressed out. Parenting a newborn is very challenging work. There are times when it totally overwhelms your normal coping abilities. EVERY NEW PARENT NEEDS TO HAVE A PLAN FOR WHO TO CALL WHEN THEY JUST CAN'T COPE ANY MORE. (And it has to be someone other than the baby's other parent!) Before your baby is born, come up with at least one person you can call for support - write their phone number down and post it on the refrigerator. Marland Kitchen Anxiety & Sadness. Baby blues are normal after pregnancy; however, there are more severe types of anxiety & sadness which can occur and should not be ignored.  They are always treatable, but you have to take the first step by reaching out for help. [facility] offers a "Mom Talk" group which meets every Tuesday from 10 am - 11 am.  This group is for new moms who need support and connection after their babies are born.  Call [phone].  Marland Kitchen Really, Really Helpful (Plan for them! Make sure these happen often!!) . Physical Support with Taking Care of Yourselves . Asking friends and family. Before your baby is born, set up a schedule of people who can come and visit and help out (or ask a friend to schedule for you). Any time someone says "let me know what I can do to help," sign them up for a day. When they get there, their job is not to take care of the baby (that's your job and your joy). Their job is to take care of you!  . Postpartum doulas. If you don't have anyone you can call on for support, look into postpartum doulas:  professionals at helping parents  with caring for baby, caring for themselves, getting breastfeeding started, and helping with household tasks. www.padanc.org is a helpful website for learning about doulas in our area. . Peer Support / Parent Groups . Why: One of the greatest ideas for new parents is to be around other new parents. Parent groups give you a chance to share and listen to others who are going through the same season of life, get a sense of what is normal infant development by watching several babies learn and grow, share your stories of triumph and struggles with empathetic ears, and forgive your own mistakes when you realize all parents are learning by trial and error. . Where to find: There are many places you can meet other new parents throughout our community.  [facility] offers the following classes for new moms and their little ones:  Baby and Me (Birth to [place]) and Breastfeeding Support Group. Go to www.conehealthybaby.com or call [phone] for more information. . Time for your Relationship . It's easy to get so caught up in meeting baby's immediate needs that it's hard to find time to connect with your partner, and meet the needs of your relationship. It's also easy to forget what "quality time with your partner" actually looks like. If you take your baby on a date, you'd be amazed how much of your couple time is spent feeding the baby, diapering the baby, admiring the baby, and talking about the baby. . Dating: Try to take time for just the two of you. Babysitter tip: Sometimes when moms are breastfeeding a newborn, they find it hard to figure out how to schedule outings around baby's unpredictable  feeding schedules. Have the babysitter come for a three hour period. When she comes over, if baby has just eaten, you can leave right away, and come back in two hours. If baby hasn't fed recently, you start the date at home. Once baby gets hungry and gets a good feeding in, you can head out for the rest of your  date time. . Date Nights at Home: If you can't get out, at least set aside one evening a week to prioritize your relationship: whenever baby dozes off or doesn't have any immediate needs, spend a little time focusing on each other. . Potential conflicts: The main relationship conflicts that come up for new parents are: issues related to sexuality, financial stresses, a feeling of an unfair division of household tasks, and conflicts in parenting styles. The more you can work on these issues before baby arrives, the better!  Clint Guy and Frills (Don't forget these. and don't feel guilty for indulging in them!) . Everyone has something in life that is a fun little treat that they do just for themselves. It may be: reading the morning paper, or going for a daily jog, or having coffee with a friend once a week, or going to a movie on Friday nights, or fine chocolates, or bubble baths, or curling up with a good book. . Unless you do fun things for yourself every now and then, it's hard to have the energy for fun with your baby. Whatever your "special" treats are, make sure you find a way to continue to indulge in them after your baby is born. These special moments can recharge you, and allow you to return to baby with a new joy   PERINATAL MOOD DISORDERS: [place]   Emergency and Crisis Resources:  If you are an imminent risk to self or others, are experiencing intense personal distress, and/or have noticed significant changes in activities of daily living, call:  . 911 . [facility]: 321-349-4339 . Mobile Crisis: 718-290-5376 . National Suicide Hotline: 862-688-0636 Or visit the following crisis centers: . Local Emergency Departments . [facility]: [address] [place]. Hours: 8:30AM-5PM. Insurance Accepted: Medicaid, Medicare, and Uninsured.  Marland Kitchen [facility]  [address], [place] Mon-Friday 8am-3pm   [phone]                                                                                    Non-Crisis Resources: To identify specific providers that are covered by your insurance, contact your insurance company or local agencies: [place]: [phone] [facility] [facility] [place]: [phone] [place]: [phone] Postpartum Support International- Regina Eck [phone]                                                      Outpatient therapy and medication management providers:  [facility] [phone] Hours: 9AM-5PM  Insurance Accepted: Alben Spittle, Lorella Nimrod, Freddrick March, [place], [place] [facility] [place] ([place] [facility]) [phone]  Hours: 8AM-5PM  nsurance Accepted: All insurances EXCEPT AARP, [place], [place], and [place]: [phone]             Hours: 8AM-8PM Insurance Accepted: Cristal Ford, Freddrick March, [place], Medicare, [phone] [facility]: [phone] Hours: 8:30AM-7PM Insurance Accepted: Cristal Ford, Medicaid, Medicare, Tricare, [employer] Counseling:  [place] Accepted:  Holland Falling, Lorella Nimrod, [facility], [place], [facility] [phone] Hours: 9AM-5:30PM Insurance Accepted: Alben Spittle, Charlotte Crumb, and Medicaid, Medicare, [employer] [facility]:  [phone] Hours: 9am-5pm Insurance Accepted: BCBS; they do not accept Medicaid/Medicare [place]: [phone] Hours: 9am-9pm Insurance Accepted: All major insurance including Medicaid and Medicare [facility]: [phone] Hours: 9AM-5:30PM Insurance Accepted: All insurances EXCEPT Medicaid and Medicare. [facility]: [place]: [phone] [place]:  [place] (support for children in the NICU and/or with special needs), [employer]: [phone]                                                                                     Online Resources: Postpartum Support International: [email or web address]/  800-944-4PPD 2Moms Supporting Moms:  www.momssupportingmoms.net

## 2020-10-31 ENCOUNTER — Ambulatory Visit (INDEPENDENT_AMBULATORY_CARE_PROVIDER_SITE_OTHER): Payer: 59 | Admitting: Advanced Practice Midwife

## 2020-10-31 ENCOUNTER — Encounter: Payer: Self-pay | Admitting: Advanced Practice Midwife

## 2020-10-31 ENCOUNTER — Other Ambulatory Visit: Payer: Self-pay

## 2020-10-31 VITALS — BP 112/74 | HR 85 | Wt 228.5 lb

## 2020-10-31 DIAGNOSIS — Z3A30 30 weeks gestation of pregnancy: Secondary | ICD-10-CM

## 2020-10-31 DIAGNOSIS — O0990 Supervision of high risk pregnancy, unspecified, unspecified trimester: Secondary | ICD-10-CM

## 2020-10-31 NOTE — Patient Instructions (Signed)
COVID-19 Vaccination if You Are Pregnant or Breastfeeding ° °The Society for Maternal-Fetal Medicine (SMFM) and other pregnancy experts recommend that pregnant and lactating people be vaccinated against COVID-19. The Centers for Disease Control and Prevention (CDC) also recommend vaccination for “all people aged 24 years and older, including people who are pregnant, breastfeeding, trying to get pregnant now, or might become pregnant in the future.” Vaccination is the best way to reduce the risks of COVID-19 infection and COVID-related complications for both you and your baby. ° °Three vaccines are available to prevent COVID-19: °• The two-dose Pfizer vaccine for people 12 years and older--APPROVED by the US Food and Drug Administration on May 14, 2020 °• The two-dose Moderna vaccine for people 18 years and older--AUTHORIZED for emergency use °• The one-dose Johnson & Johnson vaccine for people 18 years and older (you may also see this vaccine referred to as the “Janssen vaccine”)--AUTHORIZED for emergency use ° °For those receiving the Pfizer and Moderna vaccines, the second dose is given 21 days (Pfizer) and 28 days (Moderna) after the first dose. The Johnson & Johnson vaccine is only one dose. ° °Information for Pregnant Individuals °If you are pregnant or planning to become pregnant and are thinking about getting vaccinated, consider talking with your health care professional about the vaccine.  ° °To help with your decision, you should consider the following key points: °Anyone can get the COVID vaccines free of charge regardless of immigration status or whether they have insurance. You may be asked for your social security number, but it is  °NOT required to get vaccinated. ° °What are benefits of getting the COVID-19 vaccines during pregnancy?  °• The vaccines can help protect you from getting COVID-19. With the two-dose vaccines, you must get both doses for maximum effectiveness. It’s not yet known how  long protection lasts. ° °• Another potential benefit is that getting the vaccine while pregnant may help you pass antiCOVID-19 antibodies to your baby. In numerous studies of vaccinated moms, antibodies were found in the umbilical cord blood of babies and in the mother’s breastmilk. ° °• The CDC, along with other federal partners, are monitoring people who have been vaccinated for serious side effects. So far, more than 139,000 pregnant people have been °vaccinated. No unexpected pregnancy or fetal problems have occurred. There have been no reports of any increased risk of pregnancy loss, growth problems, or birth defects. ° °• A safe vaccine is generally considered one in which the benefits of being vaccinated outweigh the risks. The current vaccines are not live vaccines. There is only a very small  °chance that they cross the placenta, so it’s unlikely that they even reach the fetus. Vaccines don’t affect future fertility. The only people who should NOT get vaccinated are those who have had a severe allergic reaction to vaccines in the past or any vaccine ingredients. ° °• Side effects may occur in the first 3 days after getting vaccinated.1 These include mild to moderate fever, headache, and muscle aches. Side effects may be worse after the second dose of the Pfizer and Moderna vaccines. Fever should be avoided during pregnancy,especially in the first trimester. Those who develop a fever after vaccination can take °acetaminophen (Tylenol). This medication is safe to use during pregnancy and does not  affect how the vaccine works.  ° °What are the known risks of getting COVID-19 during pregnancy?  °About 1 to 3 per 1,000 pregnant women with COVID-19 will develop severe disease. Compared with those who   aren’t pregnant, pregnant people infected by the COVID-19 virus: °• Are 3 times more likely to need ICU care °• Are 2 to 3 times more likely to need advanced life support and a breathing tube  °• Have a small  increased risk of dying due to COVID-19 °They may also be at increased risk of stillbirth and preterm birth. ° °What is my risk of getting COVID-19?  °Your risk of getting COVID-19 depends on the chance that you will come into contact with another infected person. The risk may be higher if you live in a community where there is a lot of COVID-19 infection or work in healthcare or another high-contact setting.  ° °What is my risk for severe complications if I get COVID-19?  °Data show that older pregnant women; those with preexisting health conditions, such as a body mass index higher than 35 kg/m2, diabetes, and heart disorders; and Black or Latinx women have an especially increased risk of severe disease and death from COVID-19. °  °If you still have questions about the vaccines or need more information, ask your health care provider or go to the Centers for Disease Control and Prevention’s COVID-19 vaccine webpage.  ° °An Update on the Johnson & Johnson Vaccine  ° °In April 2021, the FDA and CDC called for a brief pause to use of the Johnson & Johnson vaccine. They did so after reports of a severe side effect in a very small number of women younger than age 50 following vaccination. This side effect, called thrombosis with thrombocytopenia syndrome (TTS), causes blood clots (thrombosis) combined with low levels of platelets (thrombocytopenia). ° °TTS following the Johnson & Johnson vaccine is extremely rare. At the time of this update, it has occurred in only 7 people per 1 million Johnson & Johnson shots given. According to the CDC, being on hormonal birth control (the pill, patch, or ring), pregnancy, breastfeeding, or being recently pregnant does not make you more likely to develop TTS after getting the Johnson & Johnson vaccine. The pause was lifted on January 13, 2020, after the FDA and CDC determined that the known benefits of the Johnson & Johnson vaccine far outweigh the risks.  ° °Health care professionals  have been alerted to the possibility of this side effect in people who have received the Johnson & Johnson vaccine. National organizations continue to recommend COVID-19 vaccination with any of the vaccines for pregnant women. All women younger than age 50 years, whether pregnant, breastfeeding, or not, should be aware of the very rare risk of TTS after getting the Johnson & Johnson vaccine. The Pfizer and Moderna vaccines don’t have this risk. If you get the Johnson & Johnson vaccine,  °seek medical help right away if you develop any of the following symptoms within 3 weeks of getting your shot: ° °• Severe or persistent headaches or blurred vision °• Shortness of breath °• Chest pain °• Leg swelling °• Persistent abdominal pain °• Easy bruising or tiny blood spots under the skin beyond the injection site ° °Experts continue to collect health and safety information from pregnant people who have been vaccinated. If you have questions about vaccination during pregnancy, visit the CDC website or talk to your health care professional. Information for Breastfeeding/Lactating Individuals The Society for Maternal-Fetal Medicine and other pregnancy experts recommend COVID-19 vaccination for people who are breastfeeding/lactating. You don’t have to delay or stop  °breastfeeding just because you get vaccinated.  ° °Getting Vaccinated  °You can get vaccinated at   any time during pregnancy. The CDC is committed to monitoring the vaccine’s safety for all individuals. Your health professional or vaccine clinic may give you information about enrolling in the v-safe after vaccination health checker (see the box below).Even after you’re fully vaccinated, it is important to follow the CDC’s guidance for wearing a mask indoors in areas where there are substantial or high rates of COVID-19 infection.  ° °What Happens When You Enroll in v-Safe?  °The v-safe after vaccination health checker program lets the CDC check in with you after  your vaccination. At sign-up, you can indicate that you are pregnant. Once you do that, expect the following: °• Someone may call you from the v-safe program to ask initial questions and get more information. °• You may be asked to enroll in the vaccine pregnancy registry, which is collecting information about any effects of the vaccine during pregnancy. This is a great way to help scientists monitor the vaccine’s safety and effectiveness.  ° °References °1. Oliver SE, Gargano JW, Marin M, Wallace M, Curran KG, Chamberland M, et al. The Advisory Committee on Immunization Practices’ Interim Recommendation for Use of Pfizer-BioNTech  °COVID-19 Vaccine -- [place], December 2020. MMWR Morbidity and Mortality Weekly Report 2020;69. °2. FDA Briefing Document. Janssen Ad26.COV2.S Vaccine for the Prevention of COVID-19. 2021. Accessed Nov 25, 2019; Available from: https://www.fda.gov/media/146217/download °3. PFIZER-BIONTECH COVID-19 VACCINE [package insert] [place]: Pfizer and [place], [place]: Biontech;2020. °4. FDA Briefing Document. Moderna COVID-19 Vaccine. 2020. Accessed 2020, Dec 18; Available from: https://www.fda.gov/media/144434/download  °5. Gray KJ, Bordt EA, Atyeo C, Deriso E, Akinwunmi B, Young N, et al. COVID-19 vaccine response in pregnant and lactating women: a cohort study. Am J Obstet Gynecol 2021 Mar 24. °6. Panagiotakopoulos L, Myers TR, Gee J, Lipkind HS, Kharbanda EO, Ryan DS, et al. SARS-CoV-2 Infection Among Hospitalized Pregnant Women: Reasons for Admission and Pregnancy  °Characteristics - [facility], March 1-Feb 19, 2019. MMWR Morb Mortal Wkly Rep 2020 Sep 23;69(38):1355-9. °7. Zambrano LD, Ellington S, Strid P, Galang RR, Oduyebo T, Tong VT, et al. Update: Characteristics of Symptomatic Women of Reproductive Age with Laboratory-Confirmed SARSCoV-2 Infection by Pregnancy Status - [place], January 22-June 25, 2019. MMWR Morb Mortal Wkly Rep 2020 Nov  6;69(44):1641-7. °8. Delahoy MJ, Whitaker M, O'Halloran A, Chai SJ, Kirley PD, Alden N, et al. Characteristics and Maternal and Birth Outcomes of Hospitalized Pregnant Women with Laboratory-Confirmed COVID-19 - COVID-NET, 13 States, March 1-May 14, 2019. MMWR Morb Mortal Wkly Rep 2020 Sep 25;69(38):1347-54. ° °

## 2020-10-31 NOTE — Progress Notes (Signed)
   PRENATAL VISIT NOTE  Subjective:  Jennifer Norman is a 25 y.o. G2P0010 at [redacted]w[redacted]d being seen today for ongoing prenatal care.  She is currently monitored for the following issues for this high-risk pregnancy and has Supervision of high risk pregnancy, antepartum; BMI 30.0-30.9,adult; History of Hirschsprung's disease; Elevated TSH; and Anemia during pregnancy in third trimester on their problem list.  Patient reports no complaints.  Contractions: Irritability. Vag. Bleeding: None.  Movement: Present. Denies leaking of fluid.   The following portions of the patient's history were reviewed and updated as appropriate: allergies, current medications, past family history, past medical history, past social history, past surgical history and problem list.   Objective:   Vitals:   10/31/20 1408  BP: 112/74  Pulse: 85  Weight: 228 lb 8 oz (103.6 kg)    Fetal Status: Fetal Heart Rate (bpm): 146   Movement: Present     General:  Alert, oriented and cooperative. Patient is in no acute distress.  Skin: Skin is warm and dry. No rash noted.   Cardiovascular: Normal heart rate noted  Respiratory: Normal respiratory effort, no problems with respiration noted  Abdomen: Soft, gravid, appropriate for gestational age.  Pain/Pressure: Present     Pelvic: Cervical exam deferred        Extremities: Normal range of motion.  Edema: None  Mental Status: Normal mood and affect. Normal behavior. Normal judgment and thought content.   Assessment and Plan:  Pregnancy: G2P0010 at [redacted]w[redacted]d 1. Supervision of high risk pregnancy, antepartum - routine care  2. [redacted] weeks gestation of pregnancy - Covid vaccine in pregnancy handout from [facility] given to patient  - Patient is moving to [place] this weekend. She reports that she has an appt with an OB there to start prenatal care on 11/05/2020. She has access to records via MyChart and will have patient sign records release so that we can send records to the office.   [facility] [address], [place], [place] [address] Phone:[phone] Fax: [phone]  Preterm labor symptoms and general obstetric precautions including but not limited to vaginal bleeding, contractions, leaking of fluid and fetal movement were reviewed in detail with the patient. Please refer to After Visit Summary for other counseling recommendations.   Return in about 2 weeks (around 11/14/2020).  Future Appointments  Date Time Provider Department Center  01/23/2021 11:15 AM [facility]    Thressa Sheller DNP, CNM  10/31/20  2:40 PM

## 2020-12-28 IMAGING — US US OB TRANSVAGINAL
1 series · 15 of 20 positions shown · non-contrast
Comparison: 12/31/2019

CLINICAL DATA: Vaginal bleeding in first trimester of pregnancy,
bleeding since 12/30/2019, fetal bradycardia on prior exam, LMP
11/12/2019

EXAM:
TRANSVAGINAL OB ULTRASOUND
TECHNIQUE: Transvaginal ultrasound was performed for complete evaluation of the
gestation as well as the maternal uterus, adnexal regions, and
pelvic cul-de-sac.

[Series 1: us ob transvaginal · 15 of 20 slices shown]
[im 1/20]
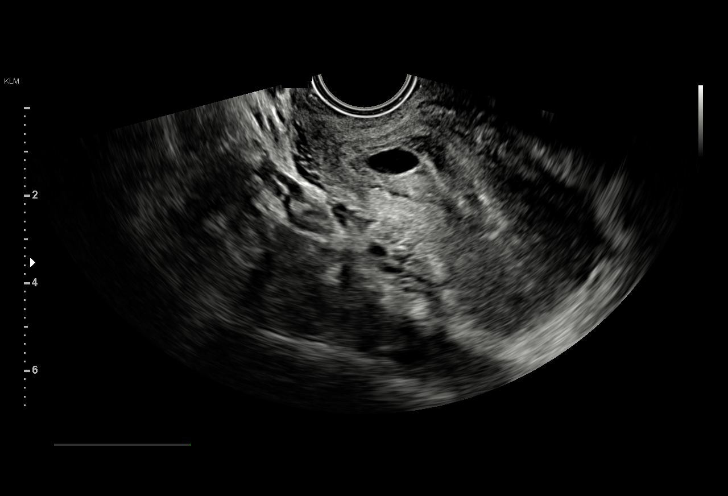
[im 3/20]
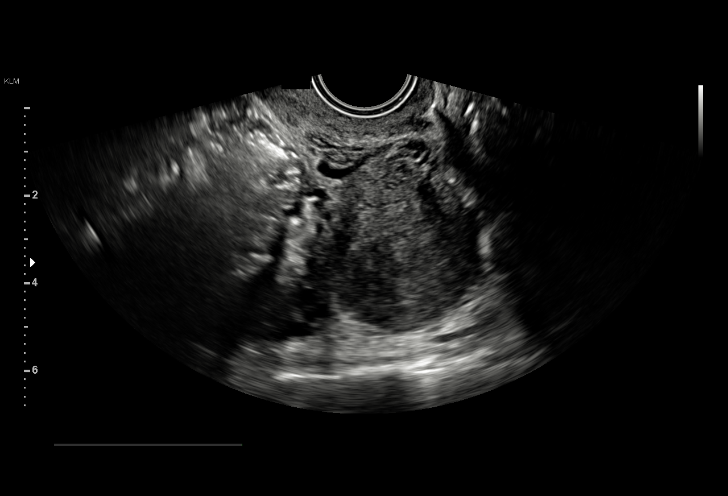
[im 4/20]
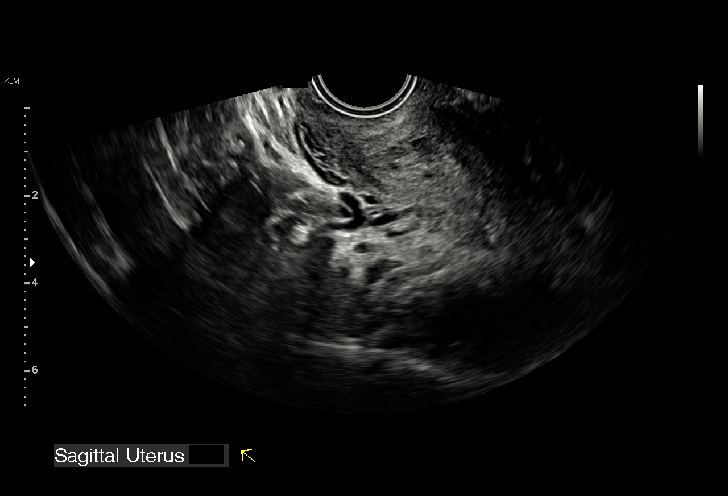
[im 5/20]
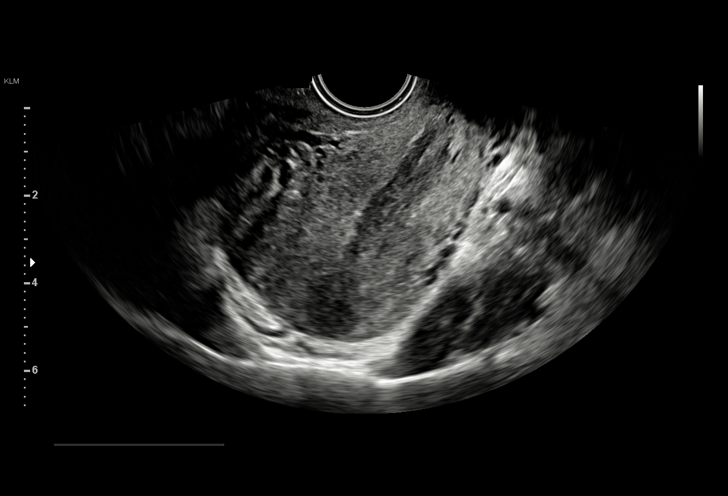
[im 7/20]
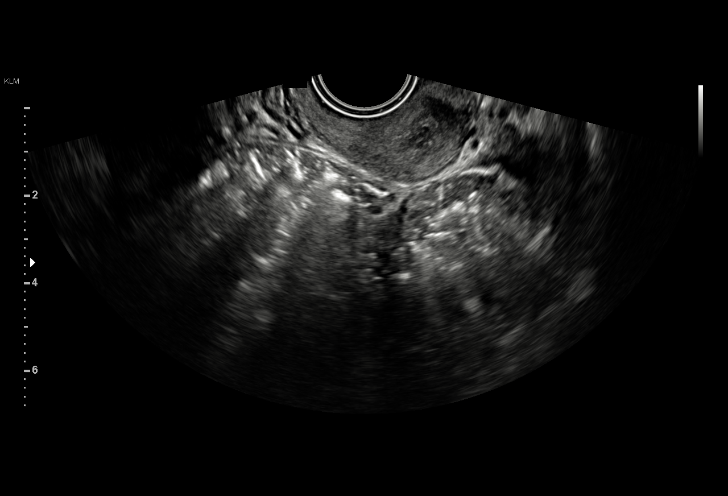
[im 8/20]
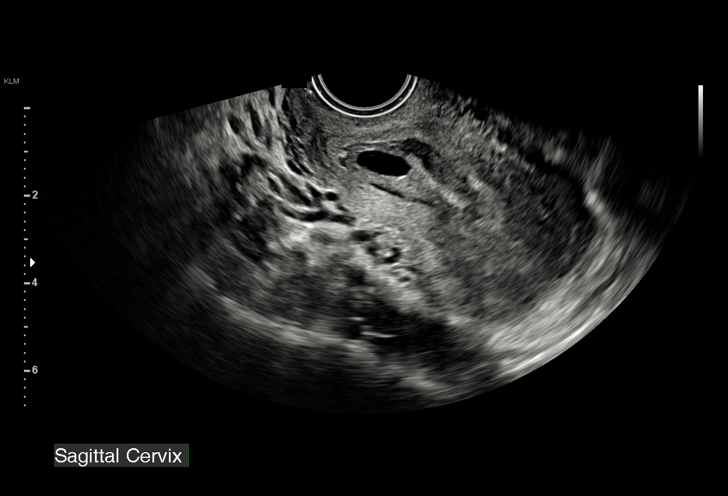
[im 9/20]
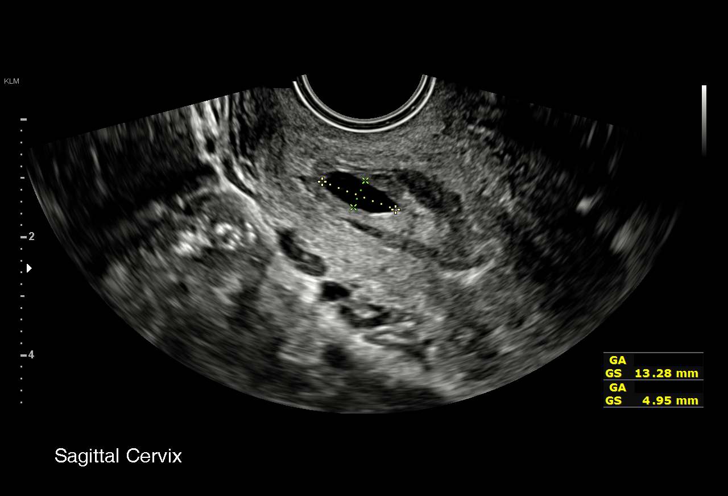
[im 11/20]
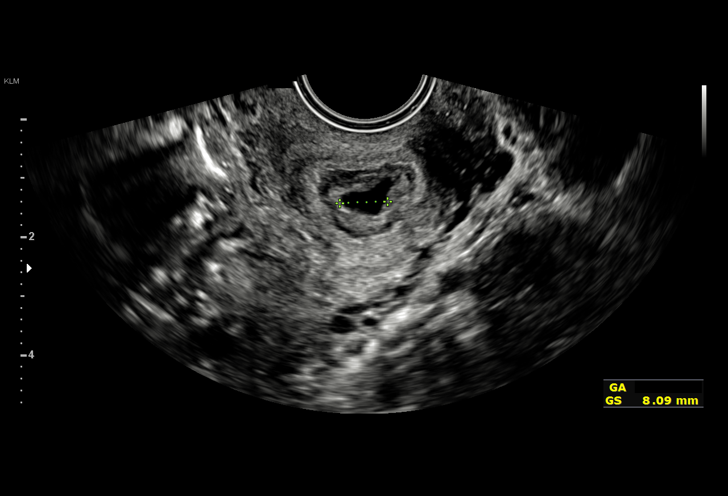
[im 12/20]
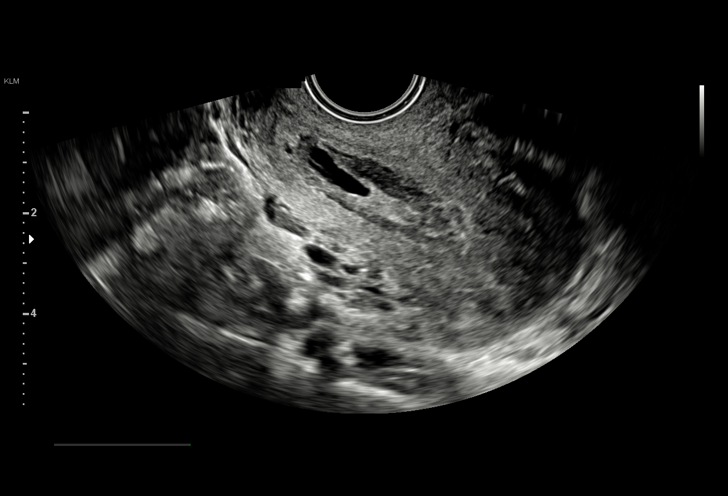
[im 13/20]
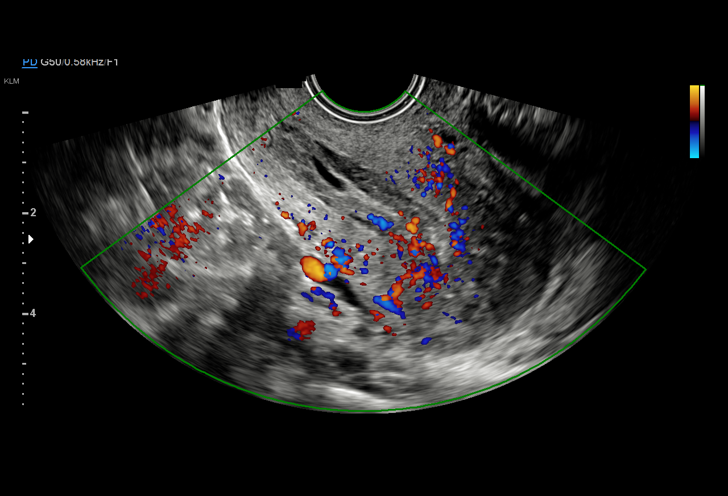
[im 15/20]
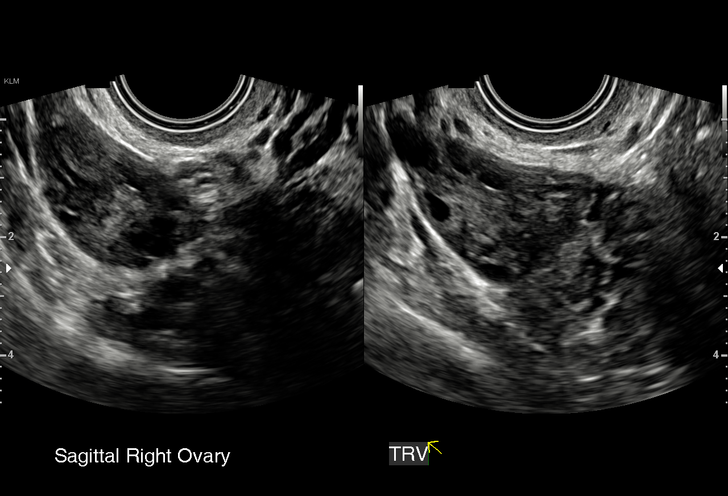
[im 16/20]
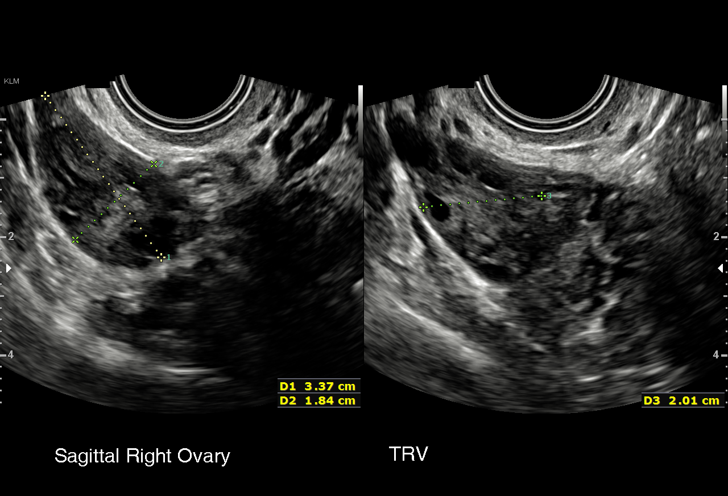
[im 17/20]
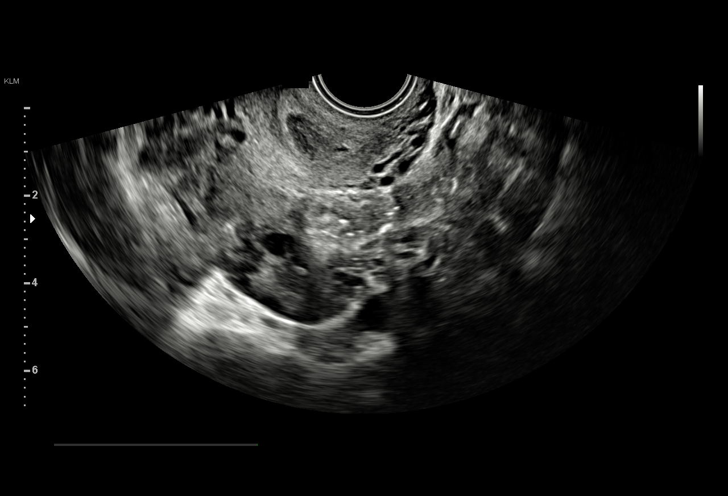
[im 19/20]
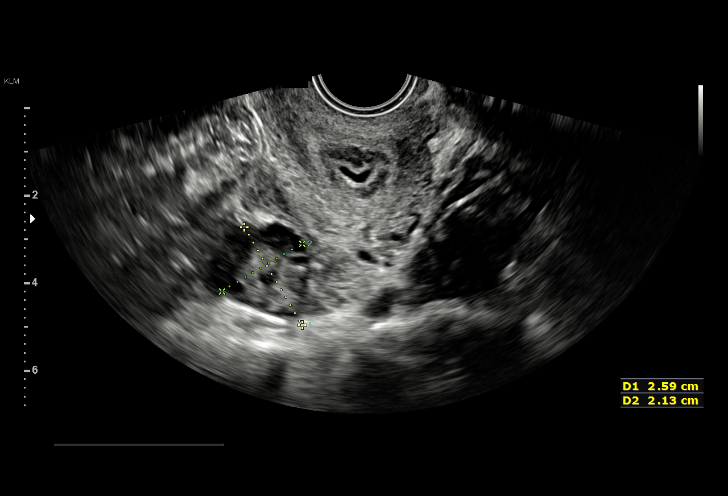
[im 20/20]
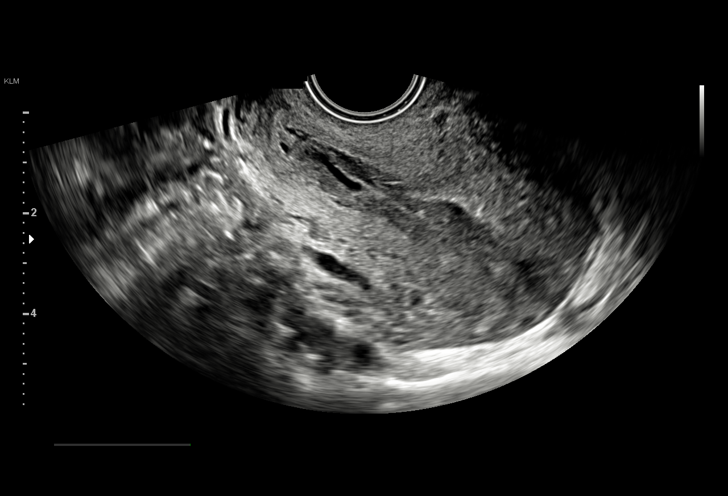

[15 of 20 positions shown; findings below may reference images not displayed]

FINDINGS: Intrauterine gestational sac: Present, abnormal, elongated, now
located at lower uterine segment, previously at upper uterine
segment

Yolk sac:  N/A

Embryo:  N/A

Cardiac Activity: N/A

Heart Rate: N/A bpm

MSD: 8.8 mm   5 w   4 d

Subchorionic hemorrhage: Blood identified surrounding gestational
sac and products of conception within endometrial canal

Maternal uterus/adnexae:

No additional uterine masses.

RIGHT ovary normal size and morphology 3.4 x 1.8 x 2.0 cm.

LEFT ovary normal size and morphology, 2.8 x 2.6 x 2.1 cm.

No free pelvic fluid or adnexal masses.
IMPRESSION: Previously identified gestational sac is now elongated and seen at
the lower uterine segment surrounded by blood, without visualization
of the fetal pole and yolk sac identified on prior study.

Findings meet definitive criteria for failed pregnancy. This follows
SRU consensus guidelines: Diagnostic Criteria for Nonviable
Pregnancy Early in the First Trimester. N Engl J Med

## 2021-01-10 NOTE — BH Specialist Note (Signed)
Pt did not arrive to video visit and did not answer the phone; Left HIPPA-compliant message to call back Camara Rosander from [facility] at  [phone] (Zakiyyah Savannah's office).  ?; left MyChart message for patient.  ? ?

## 2021-01-23 ENCOUNTER — Ambulatory Visit: Payer: 59 | Admitting: Clinical

## 2021-01-23 DIAGNOSIS — Z5329 Procedure and treatment not carried out because of patient's decision for other reasons: Secondary | ICD-10-CM

## 2021-01-23 DIAGNOSIS — Z91199 Patient's noncompliance with other medical treatment and regimen due to unspecified reason: Secondary | ICD-10-CM

## 2021-05-23 IMAGING — US US OB < 14 WEEKS - US OB TV
1 series · 15 of 28 positions shown · non-contrast
Comparison: None.

CLINICAL DATA: Pelvic pain

EXAM:
OBSTETRIC <14 WK US AND TRANSVAGINAL OB US
TECHNIQUE: Both transabdominal and transvaginal ultrasound examinations were
performed for complete evaluation of the gestation as well as the
maternal uterus, adnexal regions, and pelvic cul-de-sac.
Transvaginal technique was performed to assess early pregnancy.

[Series 1: us ob < 14 weeks - us ob tv · 112 acquisitions, 15 frames shown]
[im 1/112]
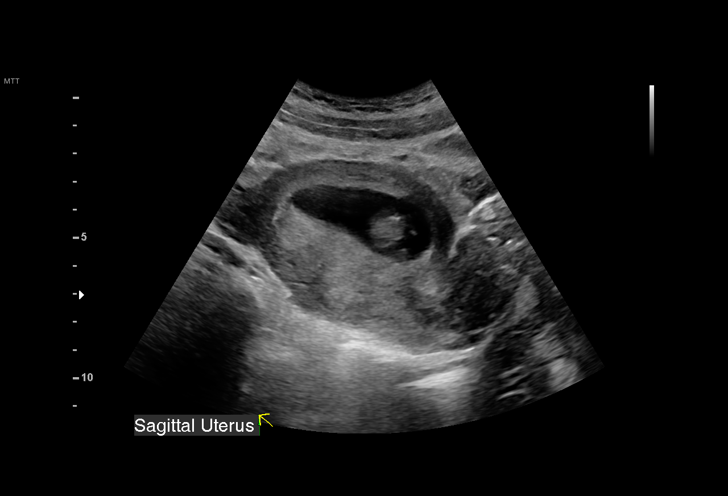
[im 9/112]
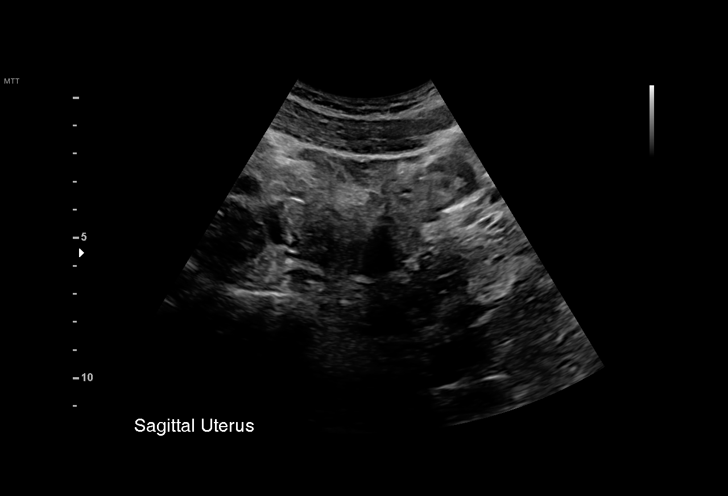
[im 17/112]
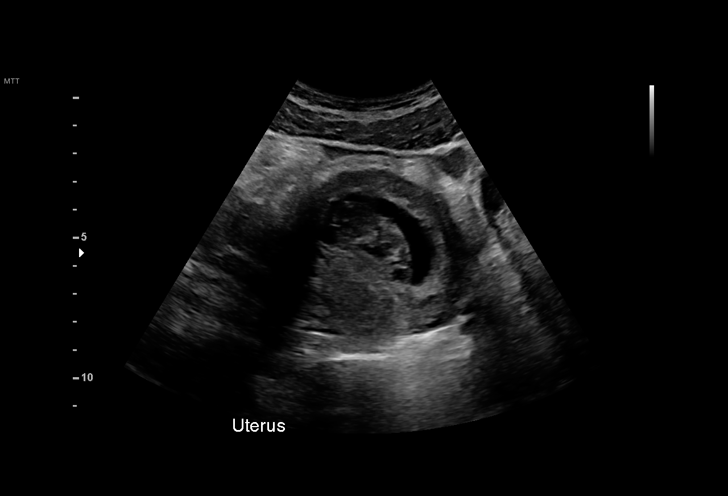
[im 25/112]
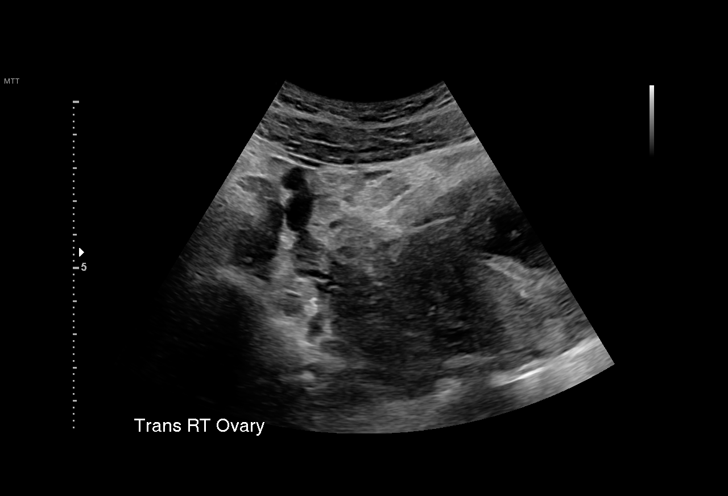
[im 33/112]
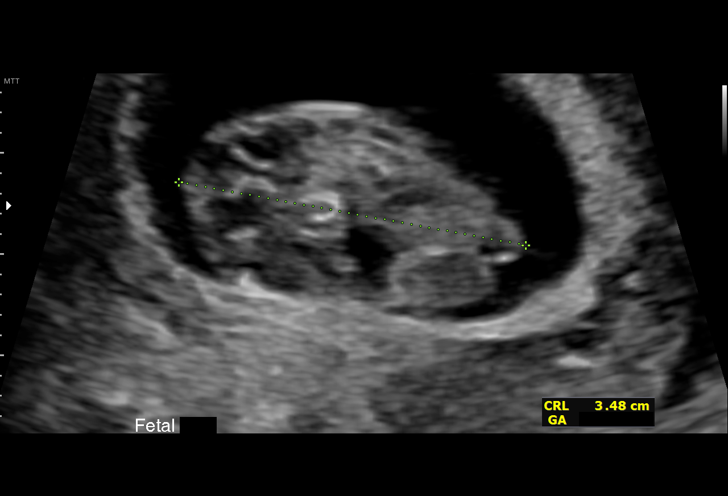
[im 42/112]
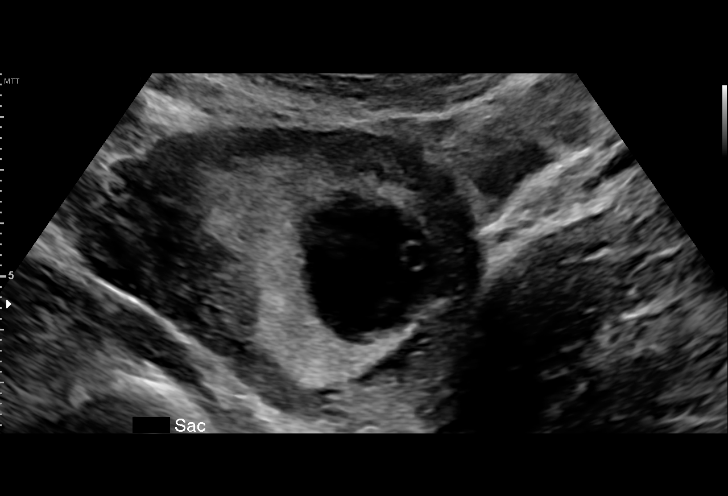
[im 50/112]
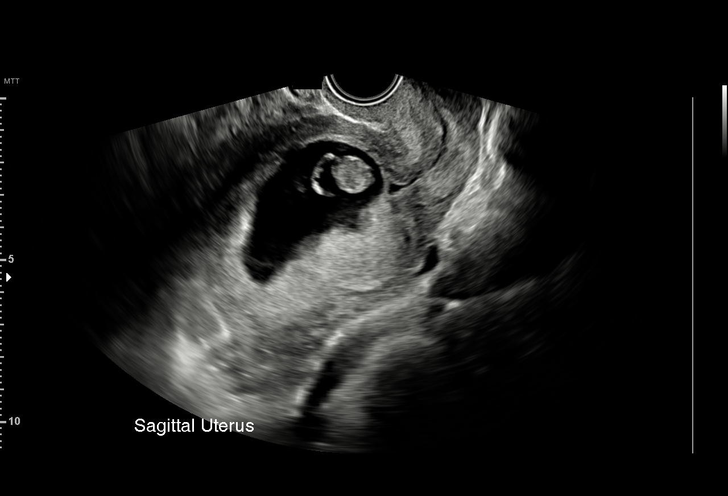
[im 58/112]
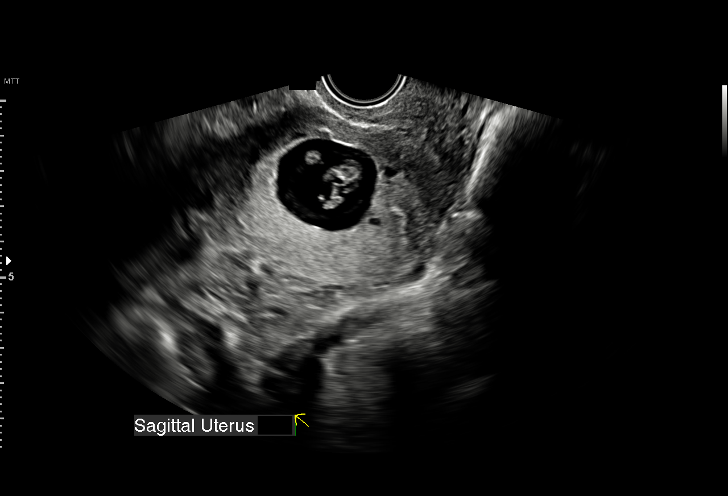
[im 62/112]
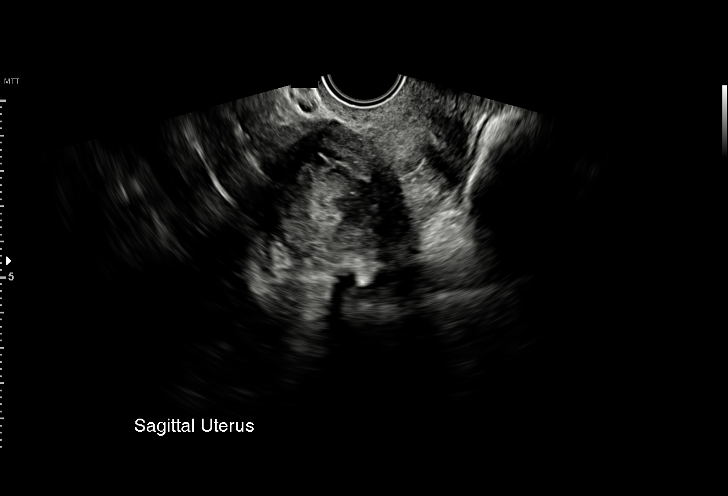
[im 70/112]
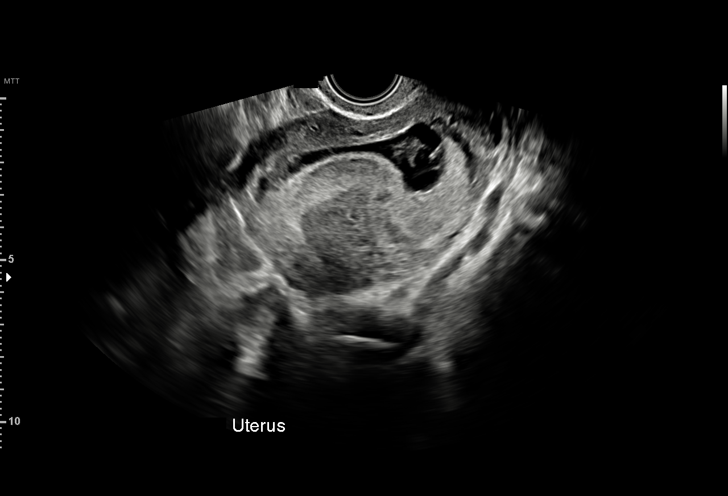
[im 79/112]
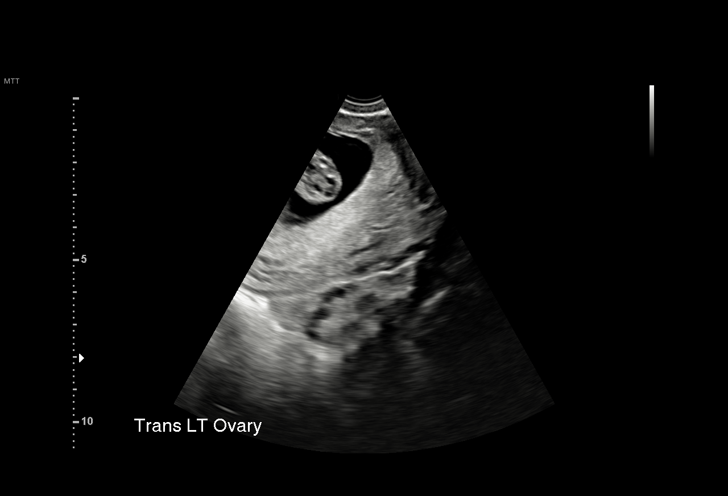
[im 87/112]
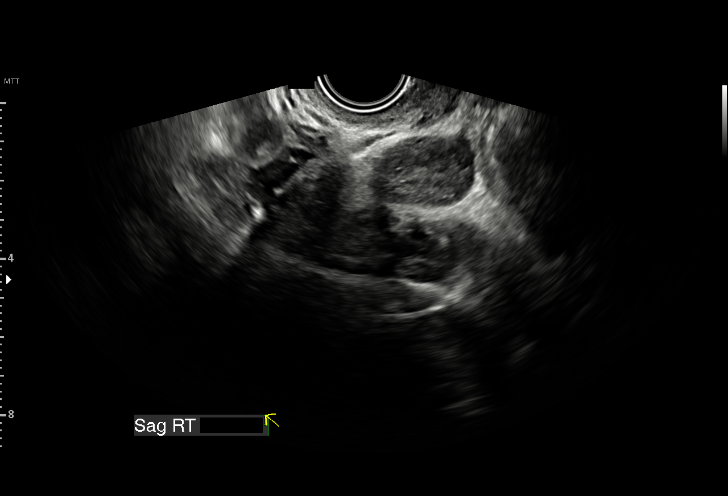
[im 95/112]
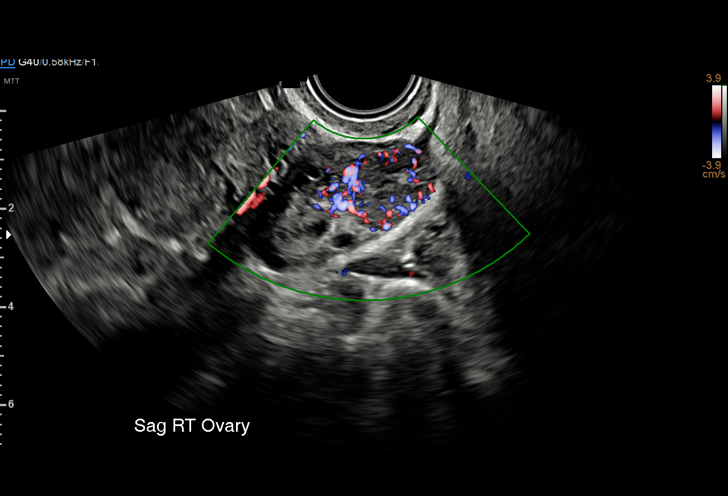
[im 103/112]
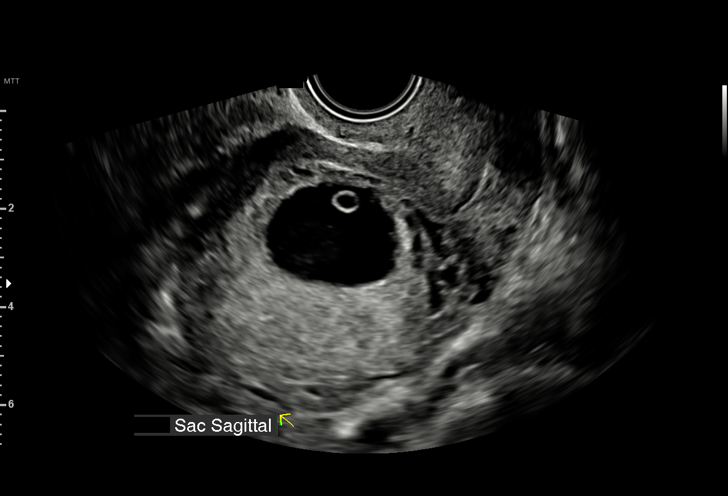
[im 112/112]
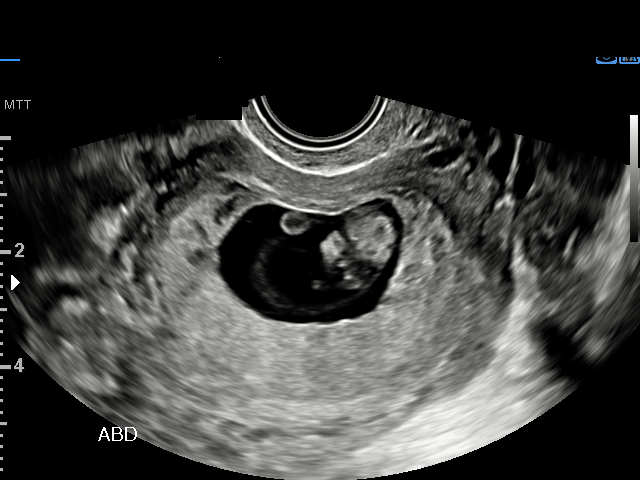

[15 of 28 positions shown; findings below may reference images not displayed]

FINDINGS: Intrauterine gestational sac: Visualized-single

Yolk sac:  Visualized

Embryo:  Visualized

Cardiac Activity: Visualized

Heart Rate: 160 bpm

CRL:  35 mm   10 w   3 d                  US EDC: December 28, 2020

Note that there is extension of bowel beyond the confines of the
abdomen which may be within normal limits at this gestational age.

Subchorionic hemorrhage:  None visualized.

Maternal uterus/adnexae: Right ovary measures 1.9 x 1.2 x 2.8 cm.
Left ovary measures 2.8 x 1.9 x 1.9 cm. No extrauterine pelvic or
adnexal mass. No appreciable free pelvic fluid.
IMPRESSION: Single live intrauterine gestational age with estimated gestational
age of 10+ weeks. Note that fetal bowel is seen outside of the
abdominal cavity which may be within normal limits at this
gestational age. A follow-up study in approximately 4 weeks to
confirm bowel within the fetal abdominal cavity would be reasonable.

No evident subchorionic hemorrhage.

No extrauterine pelvic mass or free fluid.

## 2021-06-09 IMAGING — US US OB COMP LESS 14 WK
1 series · 15 of 28 positions shown · non-contrast
Comparison: none

[Series 1: us ob comp less 14 wk · 35 acquisitions, 15 frames shown]
[im 1/35]
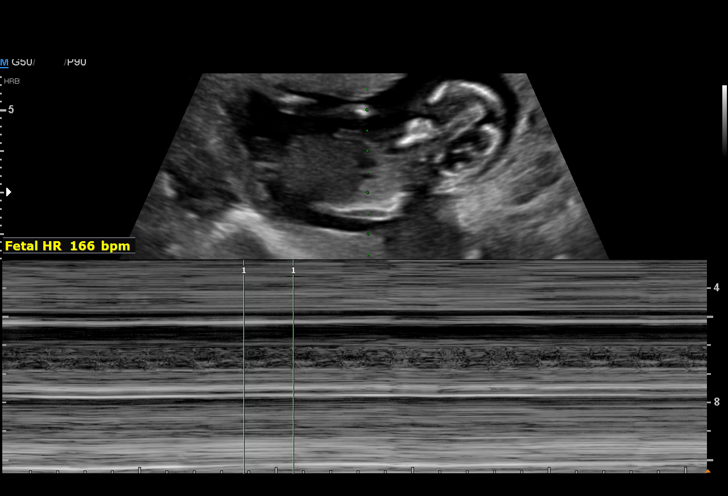
[im 3/35]
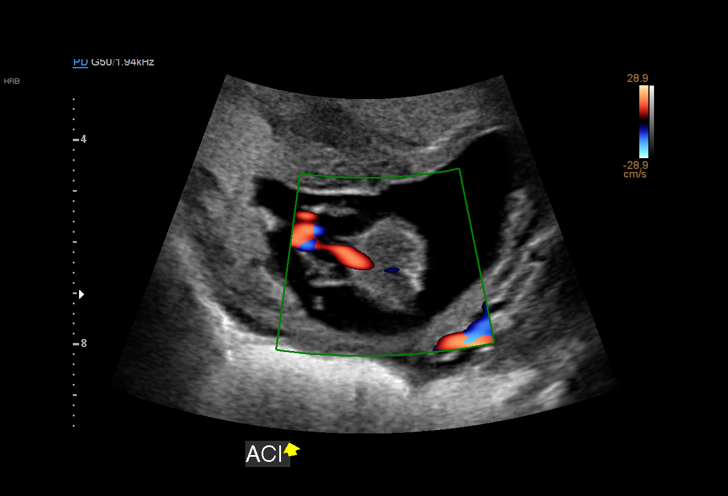
[im 6/35]
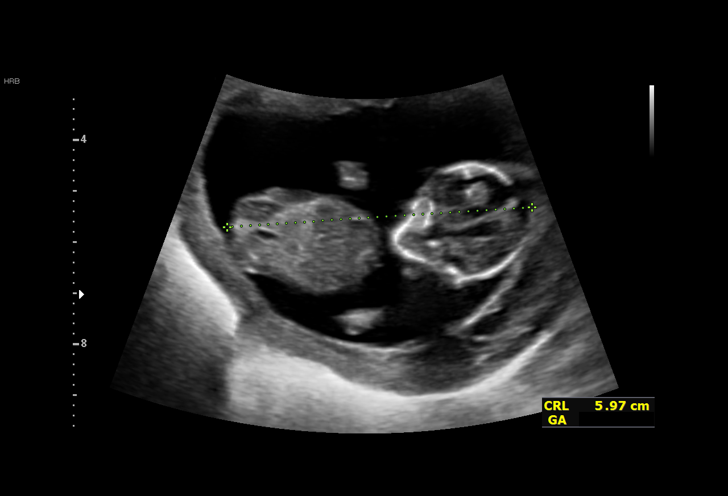
[im 8/35]
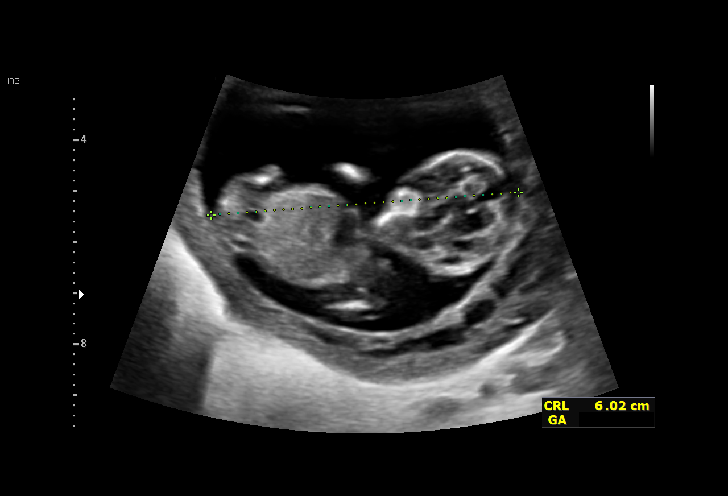
[im 11/35]
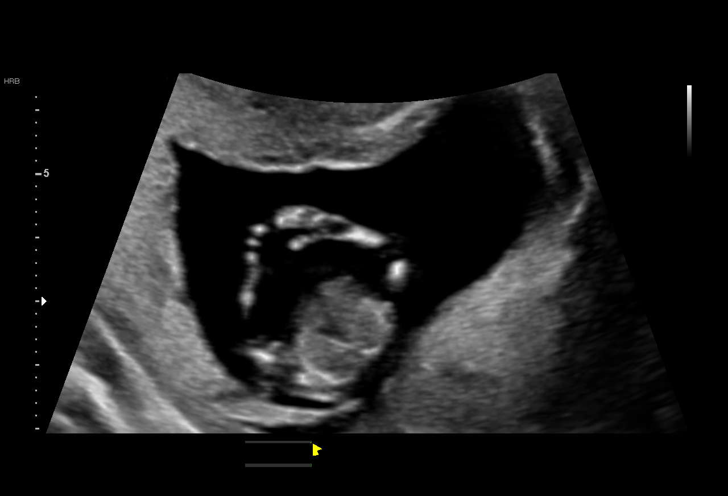
[im 13/35]
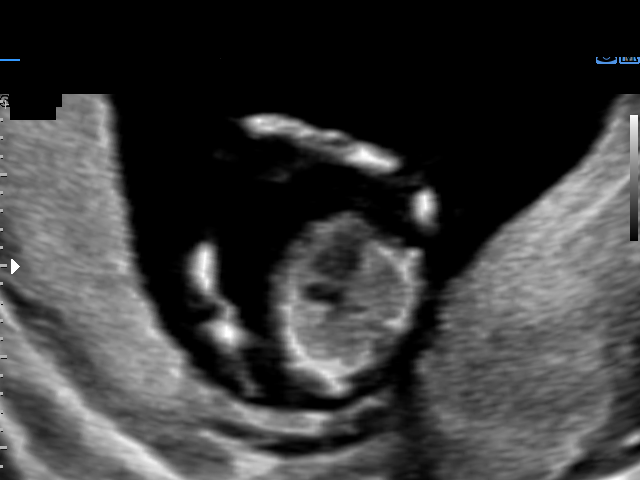
[im 16/35]
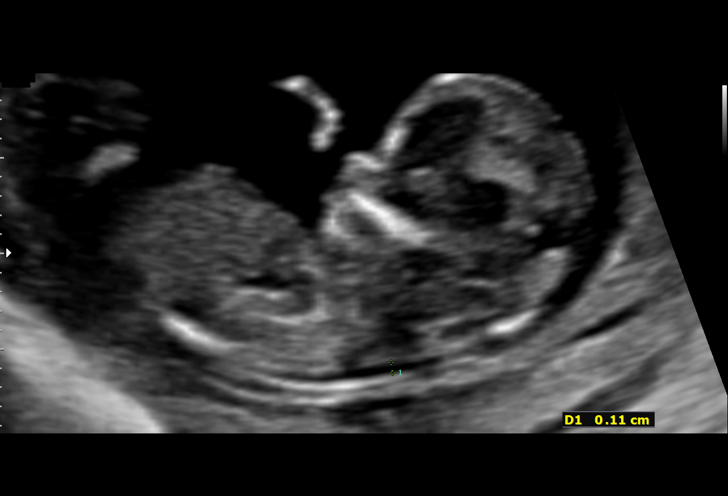
[im 18/35]
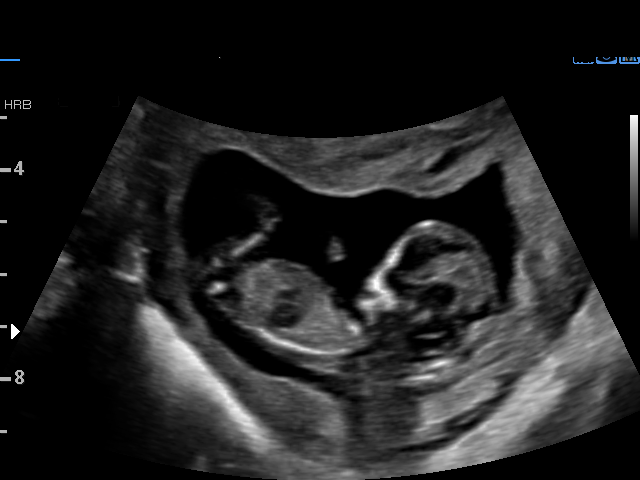
[im 19/35]
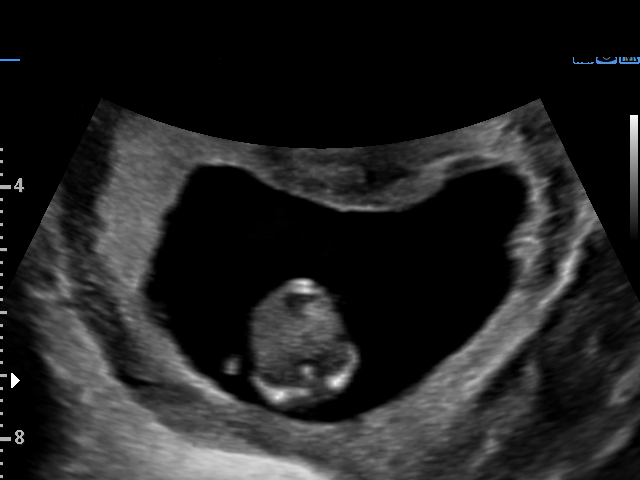
[im 22/35]
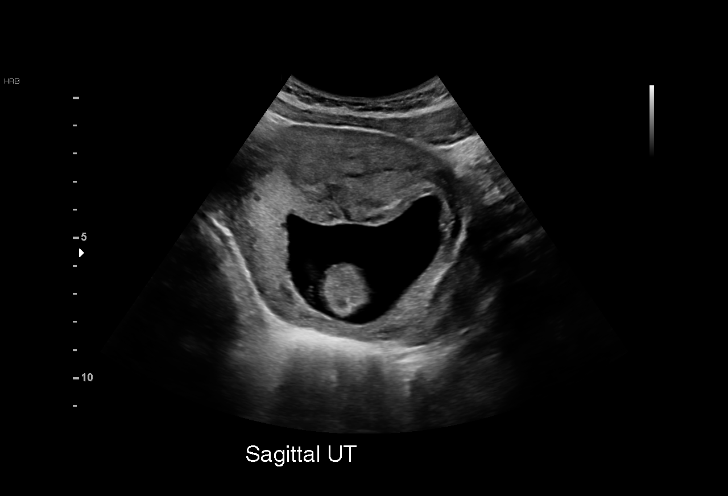
[im 24/35]
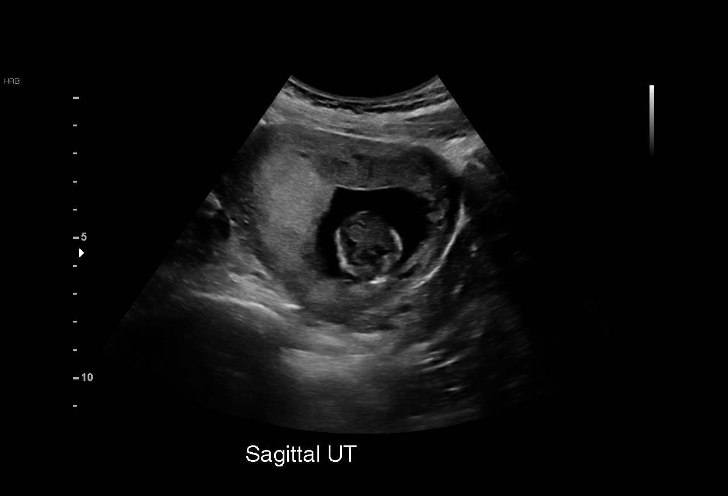
[im 27/35]
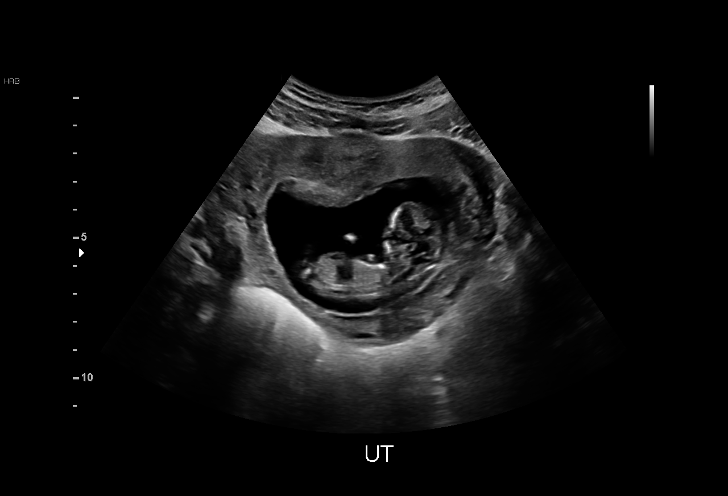
[im 29/35]
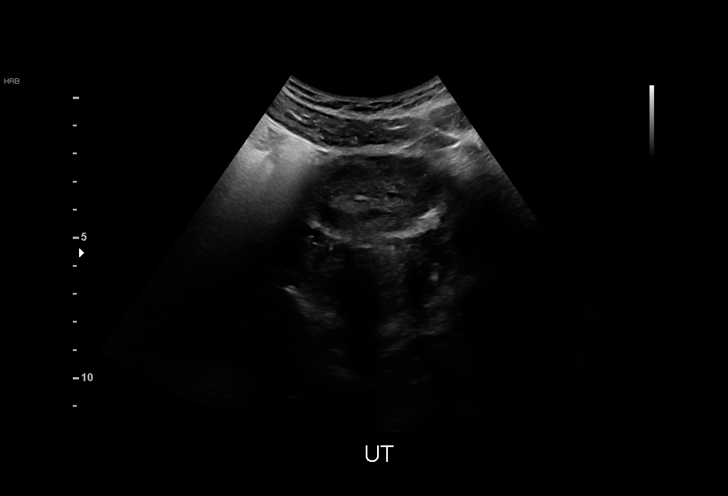
[im 32/35]
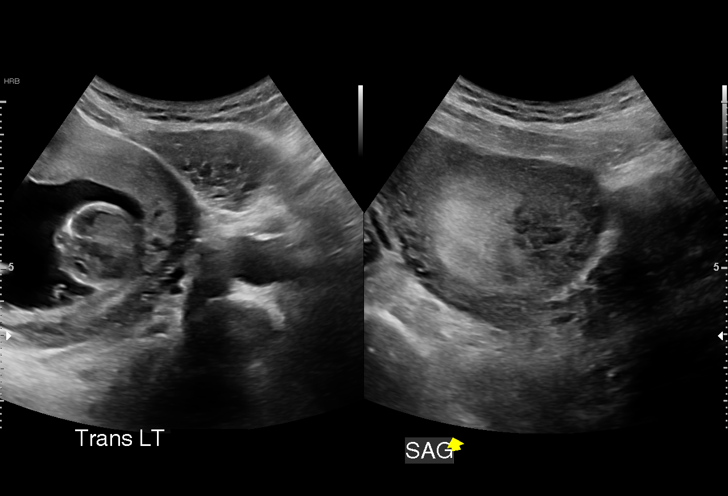
[im 35/35]
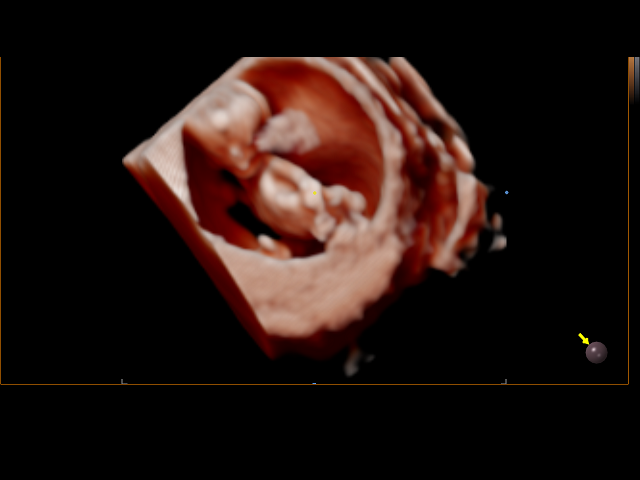

[15 of 28 positions shown; findings below may reference images not displayed]

VANBLAK NP

    ANATOMY

Indications

 Encounter for antenatal screening for
 malformations
 12 weeks gestation of pregnancy
 Fetal abnormality - suspected (midgut
 herniation)
Vital Signs

 BMI:
Fetal Evaluation

 Num Of Fetuses:         1
 Fetal Heart Rate(bpm):  166
 Cardiac Activity:       Observed
 Placenta:               Fundal

 Amniotic Fluid
 AFI FV:      Within normal limits
Biometry

 CRL:      60.2  mm     G. Age:  12w 2d                  EDD:   01/01/21
OB History
 Gravidity:    2          SAB:   1
 Living:       0
Gestational Age

 Best:          12w 6d     Det. By:  Early Ultrasound         EDD:   12/28/20
                                     (06/04/20)
1st Trimester Genetic Sonogram Screening

 Nuc Trans:       1.1  mm
Anatomy

 Cranium:               Appears normal         Abdomen:                Appears normal
 Ventricles:            Appears normal         Abdominal Wall:         Appears nml (cord
                                                                       insert, abd wall)
 Choroid Plexus:        Appears normal         Bladder:                Appears normal
 Face:                  NB visualized          Upper Extremities:      Visualized
 Thoracic:              Appears normal         Lower Extremities:      Visualized
 Stomach:               Appears normal, left
                        sided
Cervix Uterus Adnexa

 Cervix
 Not visualized (advanced GA >89wks)
Impression

 Patient is here for for evaluation of fetal abdominal wall.  On
 ultrasound performed 2 weeks ago, "fetal bowel was seen
 outside the abdominal cavity" (physiological hernia).
 Patient has not yet initiated her prenatal care and she has a
 new OB appointment on 07/05/2020.

 On ultrasound, the CRL measurement is consistent with her
 previously-established dates and good fetal heart activity is
 seen. The nuchal translucency (NT) measures 1.1 millimeters
 (not used for risk calculation), which is normal.  Abdominal
 cord insertion appears normal and there is no evidence of
 omphalocele or gastroschisis.  Fetal anatomy that could be
 ascertained at this gestational age is normal.

 We reassured the patient of the findings.  I briefly discussed
 screening for fetal aneuploidies.
Recommendations

 -An appointment was made for her to return in 6 weeks for
 fetal anatomy scan.
 -Offer NIPT at her new OB visit.
                 Msa, Chan Jin

## 2021-07-19 IMAGING — US US MFM OB COMP +14 WKS
1 series · 13 of 28 positions shown · non-contrast
Comparison: none

[Series 1: us mfm ob comp +14 wks · 13 of 119 slices shown]
[im 5/119]
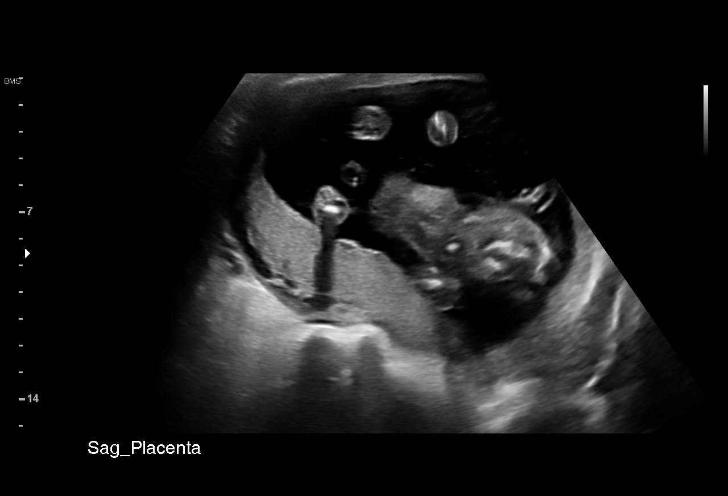
[im 14/119]
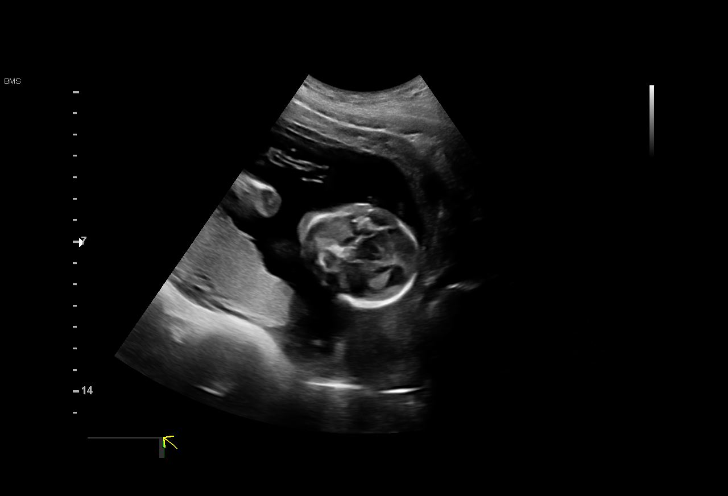
[im 22/119]
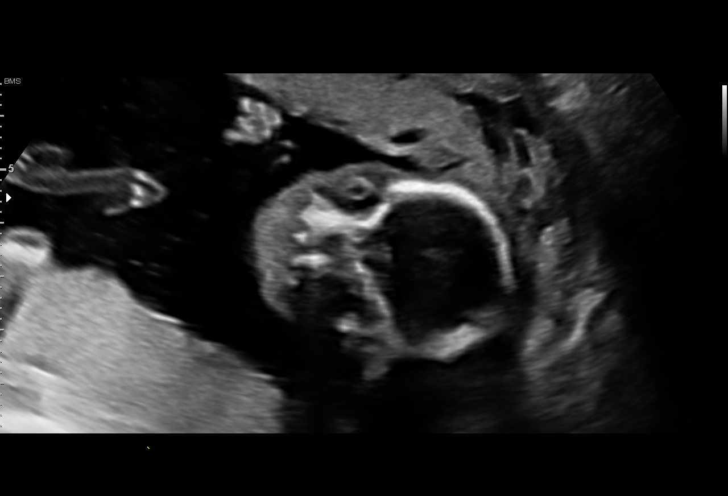
[im 31/119]
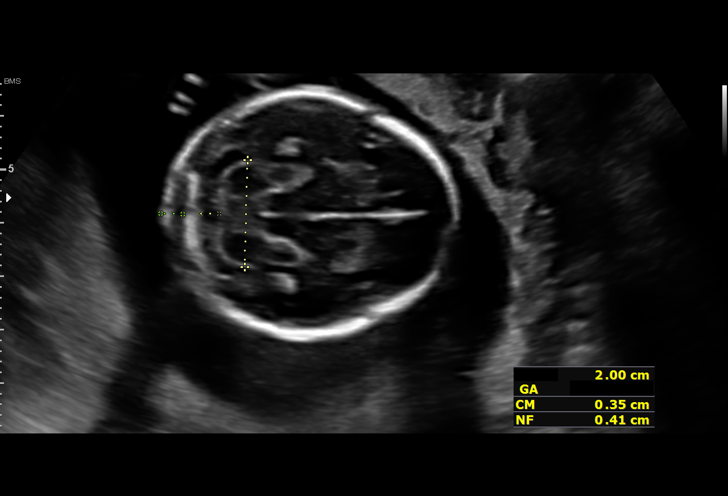
[im 40/119]
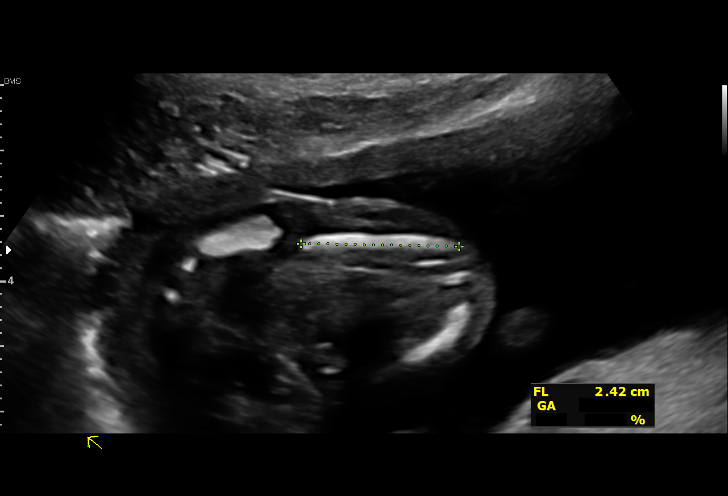
[im 49/119]
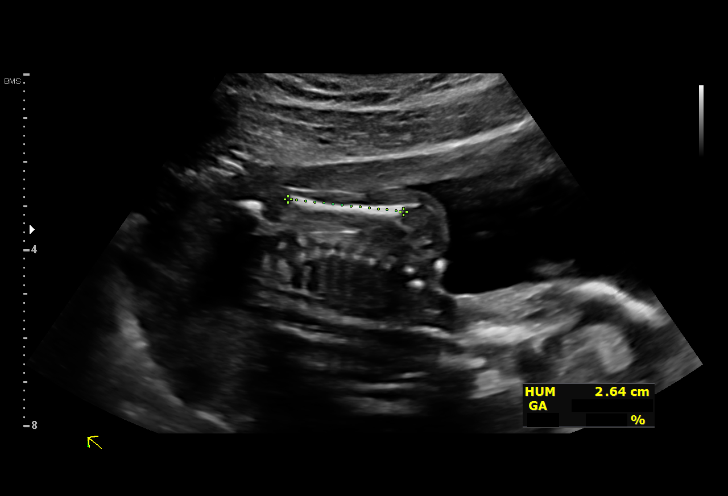
[im 62/119]
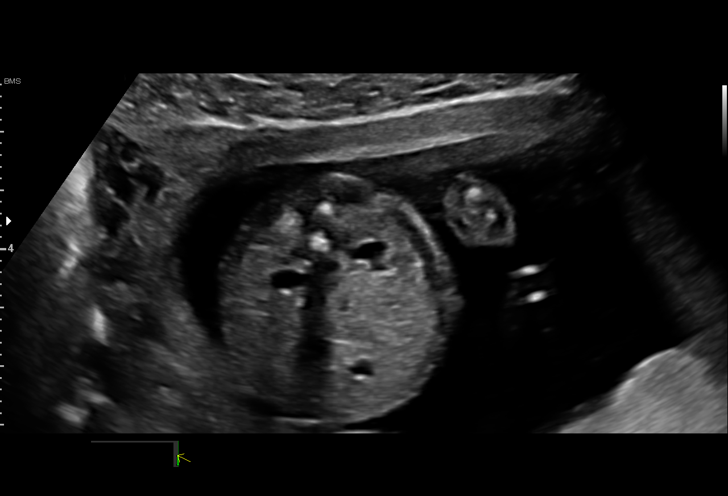
[im 70/119]
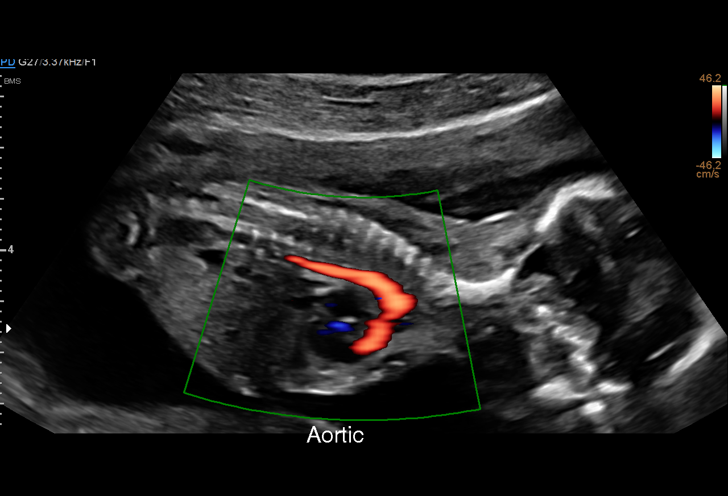
[im 79/119]
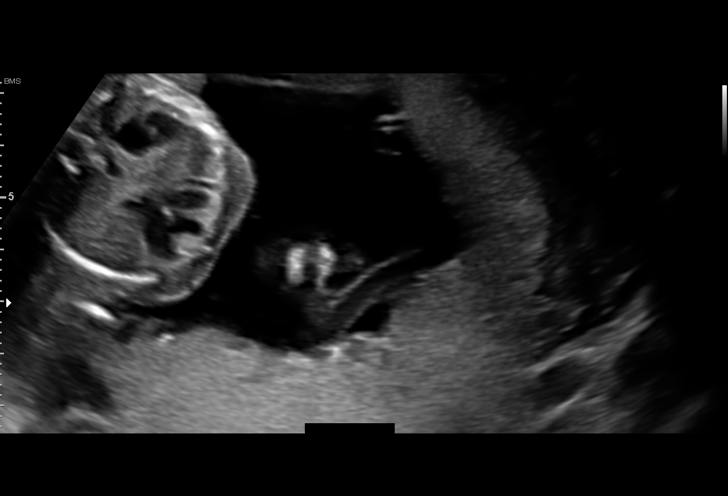
[im 88/119]
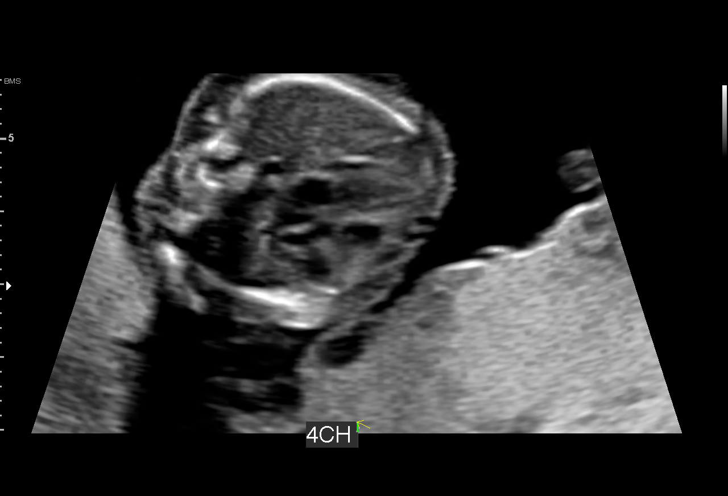
[im 97/119]
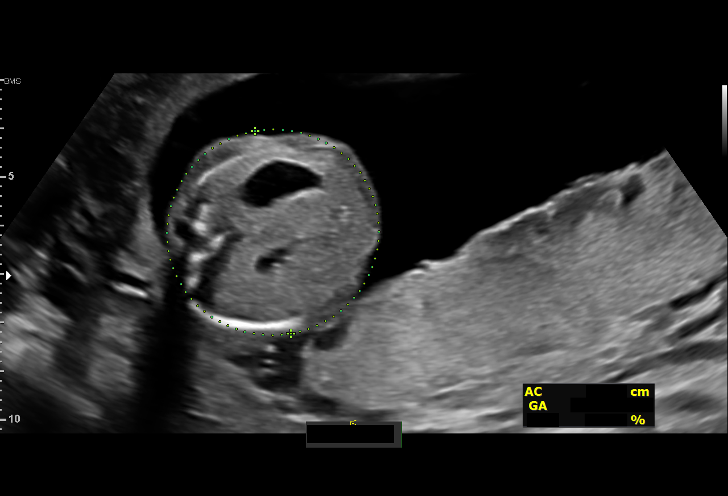
[im 105/119]
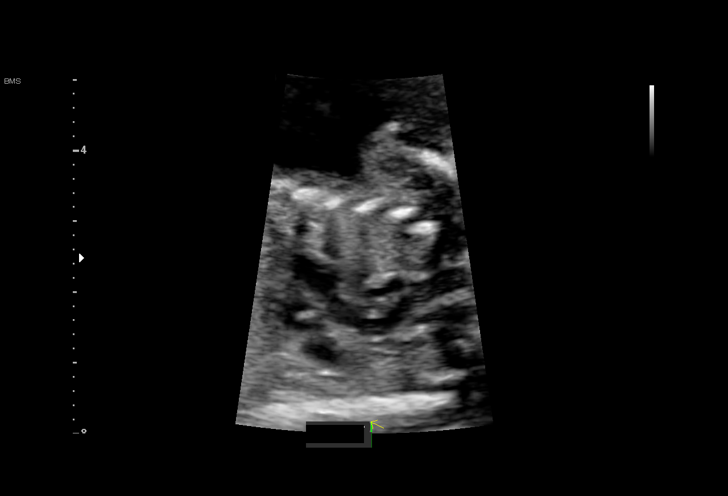
[im 114/119]
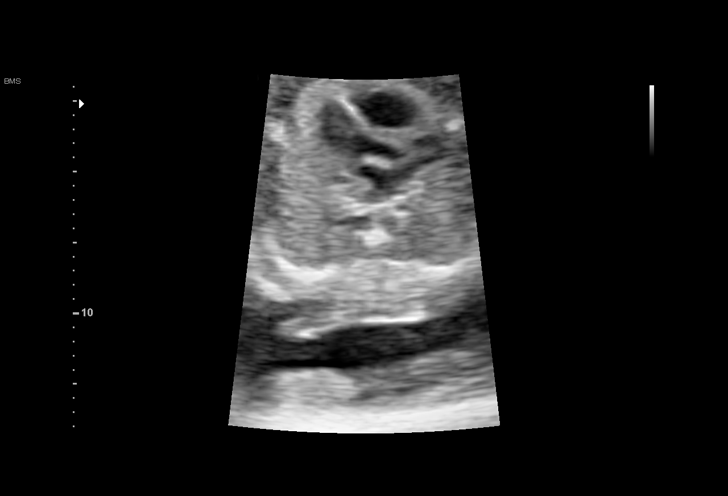

[13 of 28 positions shown; findings below may reference images not displayed]

MOOLMAN NP

Indications

 Encounter for antenatal screening for
 malformations (LR Nips)
 Fetal abnormality - suspected (midgut
 herniation)
 Medical complication of pregnancy
 (Hirschsprungs)
 Anxiety during pregnancy, second trimester     O99.342,
 17 weeks gestation of pregnancy
Vital Signs

                                                Height:        5'9"
Fetal Evaluation

 Num Of Fetuses:         1
 Fetal Heart Rate(bpm):  145
 Cardiac Activity:       Observed
 Presentation:           Cephalic
 Placenta:               Fundal
 P. Cord Insertion:      Visualized, central

 Amniotic Fluid
 AFI FV:      Within normal limits

                             Largest Pocket(cm)

Biometry

 BPD:      44.9  mm     G. Age:  19w 4d         98  %    CI:        81.49   %    70 - 86
                                                         FL/HC:      16.1   %    15.8 - 18
 HC:       157   mm     G. Age:  18w 4d         83  %    HC/AC:      1.18        1.07 -
 AC:      133.3  mm     G. Age:  18w 6d         82  %    FL/BPD:     56.1   %
 FL:       25.2  mm     G. Age:  17w 4d         41  %    FL/AC:      18.9   %    20 - 24
 HUM:      26.4  mm     G. Age:  18w 2d         74  %
 CER:      18.5  mm     G. Age:  18w 2d         77  %
 NFT:       4.1  mm

 LV:          4  mm
 CM:          4  mm

 Est. FW:     235  gm      0 lb 8 oz     82  %
OB History

 Gravidity:    2          SAB:   1
 Living:       0
Gestational Age

 Clinical EDD:  17w 5d                                        EDD:   01/03/21
 U/S Today:     18w 5d                                        EDD:   12/27/20
 Best:          17w 5d     Det. By:  Clinical EDD             EDD:   01/03/21
Anatomy

 Cranium:               Appears normal         LVOT:                   Appears normal
 Cavum:                 Appears normal         Aortic Arch:            Appears normal
 Ventricles:            Appears normal         Ductal Arch:            Appears normal
 Choroid Plexus:        Appears normal         Diaphragm:              Appears normal
 Cerebellum:            Appears normal         Stomach:                Appears normal, left
                                                                       sided
 Posterior Fossa:       Appears normal         Abdomen:                Appears normal
 Nuchal Fold:           Appears normal         Abdominal Wall:         Appears nml (cord
                                                                       insert, abd wall)
 Face:                  Orbits and profile     Cord Vessels:           Appears normal (3
                        previously seen                                vessel cord)
 Lips:                  Appears normal         Kidneys:                Appear normal
 Palate:                Not well visualized    Bladder:                Appears normal
 Thoracic:              Appears normal         Spine:                  Appears normal
 Heart:                 Appears normal         Upper Extremities:      Appears normal
                        (4CH, axis, and
                        situs)
 RVOT:                  Appears normal         Lower Extremities:      Appears normal

 Other:  Hands and feet visualized. 5th digit visualized. Heels visualized.
Cervix Uterus Adnexa

 Cervix
 Length:           3.49  cm.
 Normal appearance by transabdominal scan.
 Uterus
 No abnormality visualized.

 Right Ovary
 Not visualized.

 Left Ovary
 Within normal limits.

 Cul De Sac
 No free fluid seen.

 Adnexa
 No adnexal mass visualized.
Impression

 Single intrauterine pregnancy with measurements consistent
 with dates
 There is good fetal movement and amniotic fluid volume.
 Normal anatomy observed today.
Recommendations

 Follow up as clinically indicated.
